# Patient Record
Sex: Female | Born: 1975 | Race: White | Hispanic: No | Marital: Single | Smoking: Former smoker
Health system: Southern US, Community
[De-identification: ages and names within clinical notes are randomized; demographics above are authoritative.]

## PROBLEM LIST (undated history)

## (undated) DIAGNOSIS — R51 Headache: Secondary | ICD-10-CM

## (undated) HISTORY — PX: PILONIDAL CYST EXCISION: SHX744

---

## 2008-11-16 ENCOUNTER — Emergency Department (HOSPITAL_COMMUNITY): Admission: EM | Admit: 2008-11-16 | Discharge: 2008-11-16 | Payer: Self-pay | Admitting: Emergency Medicine

## 2009-07-11 ENCOUNTER — Emergency Department (HOSPITAL_COMMUNITY): Admission: EM | Admit: 2009-07-11 | Discharge: 2009-07-11 | Payer: Self-pay | Admitting: Emergency Medicine

## 2010-10-22 LAB — URINALYSIS, ROUTINE W REFLEX MICROSCOPIC
Protein, ur: NEGATIVE mg/dL
Urobilinogen, UA: 0.2 mg/dL (ref 0.0–1.0)

## 2010-10-22 LAB — POCT PREGNANCY, URINE

## 2011-08-12 ENCOUNTER — Other Ambulatory Visit: Payer: Self-pay | Admitting: Occupational Medicine

## 2011-08-12 ENCOUNTER — Ambulatory Visit: Payer: Self-pay

## 2011-08-12 DIAGNOSIS — R52 Pain, unspecified: Secondary | ICD-10-CM

## 2011-09-10 ENCOUNTER — Ambulatory Visit (INDEPENDENT_AMBULATORY_CARE_PROVIDER_SITE_OTHER): Payer: Worker's Compensation | Admitting: Family Medicine

## 2011-09-10 VITALS — BP 100/60 | Ht 67.0 in | Wt 165.0 lb

## 2011-09-10 DIAGNOSIS — M65849 Other synovitis and tenosynovitis, unspecified hand: Secondary | ICD-10-CM

## 2011-09-10 DIAGNOSIS — S63599A Other specified sprain of unspecified wrist, initial encounter: Secondary | ICD-10-CM

## 2011-09-10 DIAGNOSIS — M659 Synovitis and tenosynovitis, unspecified: Secondary | ICD-10-CM

## 2011-09-10 MED ORDER — KETOPROFEN POWD: Status: DC

## 2011-09-10 NOTE — Progress Notes (Signed)
[facility]:  Patient Name: Emily Case Date of Birth: Aug 22, 1975 Medical Record Number: [id number] Gender: female Date of Encounter: 09/10/2011  Dear Dr. Theresia Lo:  Thank you for having me see Aniceto Boss in consultation today at the [facility] for her problem with her left wrist.  As you may recall, she is a 36 y.o. year old female with a history of a left wrist injury occurring on 08/10/2011.  She reports that she was lifting a 55 pound bag, and then the bag slipped and moved some and then her wrist was forcefully supinated. She was immobilized for approximately a week in a cockup wrist splint, and then she has resumed full duty beginning on 09/03/2011. She did have some pain with motion, particularly the with supination, and she is having pain in that distal dorsum of her forearm.  At this point, she denies any numbness or tingling.  Past medical history: Denies anything significant Past surgical history: Negative Family history: Noncontributory Social history: The patient works at [employer]. She has children.   Review of Systems:  GEN: No fevers, chills. Nontoxic. Primarily MSK c/o today. MSK: Detailed in the HPI GI: tolerating PO intake without difficulty Neuro: No numbness, parasthesias, or tingling associated. Otherwise the pertinent positives of the ROS are noted above.    Physical Examination: Filed Vitals:   09/10/11 1406  BP: 100/60  Height: 5\' 7"  (1.702 m)  Weight: 165 lb (74.844 kg)      GEN: WDWN, NAD, Non-toxic, Alert & Oriented x 3 HEENT: Atraumatic, Normocephalic.  Ears and Nose: No external deformity. EXTR: No clubbing/cyanosis/edema NEURO: Normal gait.  PSYCH: Normally interactive. Conversant. Not depressed or anxious appearing.  Calm demeanor.   Hand: LEFT eymosis or edema: neg ROM wrist/hand/digits/elbow: full  Carpals, MCP's, digits: NT Distal Ulna and Radius: Tender, most notably with squeeze just  proximal to the DRUJ. TFCC is tender to direct palpation Mild pain with radial deviation Ecchymosis or edema: neg Cysts/nodules: neg Finkelstein's test: neg Snuffbox tenderness: neg Scaphoid tubercle: NT Hook of Hamate: NT Resisted supination: NT Full composite fist Grip, all digits: 5/5 str Flexion, ext at DIP, PIP, MCP, wrist all 5/5 Axial load test: neg Phalen's: neg Tinel's: neg Atrophy: neg  Hand sensation: intact  Diagnostic Ultrasound Evalution: [employer] E, MSK ultrasound, MSK probe Anatomy scanned: Left wrist Indication: Pain Findings: Increased fluid in the fourth dorsal compartment. No significant tearing visualized at the distal radial ulnar joint. TFCC appears intact without any obvious tears on ultrasound. Fifth compartment, tended to move easily without significant additional fluid. Third compartment, tendons quite easily without any additional fluid.   Assessment and Plan:  Impression: DRUJ sprain and fourth dorsal compartment tenosynovitis   Recommendations: I given her a wrist loop compression wrap to help stabilize the DRUJ. She is that he uses at work over the next few weeks. In about 2 weeks, she is going to start doing some wrist rehabilitation that we reviewed. Also gave her a prescription for ketoprofen 20% gel. Apply 3-4 times daily over the next 2-3 weeks.  I cannot fully exclude a TFCC lesion, and she certainly does have tenderness there clinically, but no tears visualized on ultrasound. Ultimately if she continues to do poorly, an MR arthrogram of the wrist would be appropriate to evaluate this anatomy.   We will see the patient back in 1 month.  Thank you for having [place] see Aniceto Boss in consultation.  Feel free to contact me  with any questions.  Chelsie Burel T. Clarrisa Kaylor, MD, CAQ Sports Medicine [facility] [address] [place], [place] [address] Phone: [phone] Fax: [phone]

## 2013-05-13 ENCOUNTER — Other Ambulatory Visit (HOSPITAL_COMMUNITY): Payer: Self-pay | Admitting: Obstetrics and Gynecology

## 2013-05-13 DIAGNOSIS — Z3682 Encounter for antenatal screening for nuchal translucency: Secondary | ICD-10-CM

## 2013-05-14 ENCOUNTER — Ambulatory Visit (HOSPITAL_COMMUNITY)
Admission: RE | Admit: 2013-05-14 | Discharge: 2013-05-14 | Disposition: A | Discharge status: Home / Self Care | Payer: Medicaid Other | Source: Ambulatory Visit | Attending: Obstetrics and Gynecology | Admitting: Obstetrics and Gynecology

## 2013-05-14 ENCOUNTER — Encounter (HOSPITAL_COMMUNITY): Payer: Self-pay

## 2013-05-14 ENCOUNTER — Other Ambulatory Visit: Payer: Self-pay

## 2013-05-14 ENCOUNTER — Ambulatory Visit (HOSPITAL_COMMUNITY): Payer: Worker's Compensation

## 2013-05-14 ENCOUNTER — Telehealth (HOSPITAL_COMMUNITY): Payer: Self-pay | Admitting: *Deleted

## 2013-05-14 DIAGNOSIS — Z3682 Encounter for antenatal screening for nuchal translucency: Secondary | ICD-10-CM

## 2013-05-14 DIAGNOSIS — Z3689 Encounter for other specified antenatal screening: Secondary | ICD-10-CM | POA: Insufficient documentation

## 2013-05-14 DIAGNOSIS — O09529 Supervision of elderly multigravida, unspecified trimester: Secondary | ICD-10-CM | POA: Insufficient documentation

## 2013-05-14 DIAGNOSIS — O351XX0 Maternal care for (suspected) chromosomal abnormality in fetus, not applicable or unspecified: Secondary | ICD-10-CM | POA: Insufficient documentation

## 2013-05-14 DIAGNOSIS — O3510X0 Maternal care for (suspected) chromosomal abnormality in fetus, unspecified, not applicable or unspecified: Secondary | ICD-10-CM | POA: Insufficient documentation

## 2013-05-17 NOTE — Progress Notes (Signed)
Genetic Counseling  High-Risk Gestation Note  Appointment Date:  05/14/2013 Referred By: Bing Plume, MD Date of Birth:  [birth year]   Pregnancy History: [id number] Estimated Date of Delivery: 11/14/13 Estimated Gestational Age: [redacted]w[redacted]d Attending: Particia Nearing, MD   Ms. Tonianne Fine was seen for genetic counseling because of a maternal age of 37 y.o..     She was counseled regarding maternal age and the association with risk for chromosome conditions due to nondisjunction with aging of the ova.   We reviewed chromosomes, nondisjunction, and the associated 1 in 114 risk for fetal aneuploidy related to a maternal age of 37 y.o. at [redacted]w[redacted]d gestation.  She was counseled that the risk for aneuploidy decreases as gestational age increases, accounting for those pregnancies which spontaneously abort.  We specifically discussed Down syndrome (trisomy 27), trisomies 15 and 35, and sex chromosome aneuploidies (47,XXX and 47,XXY) including the common features and prognoses of each.   We reviewed available screening options including First Screen, Quad screen, noninvasive prenatal screening (NIPS)/cell free fetal DNA (cffDNA) testing, and detailed ultrasound.  She was counseled that screening tests are used to modify a patient's a priori risk for aneuploidy, typically based on age. This estimate provides a pregnancy specific risk assessment. We reviewed the benefits and limitations of each option. Specifically, we discussed the conditions for which each test screens, the detection rates, and false positive rates of each. She was also counseled regarding diagnostic testing via amniocentesis. We reviewed the approximate 1 in 300-500 risk for complications for amniocentesis, including spontaneous pregnancy loss. After consideration of all the options, she elected to proceed with NIPS (Panorama).  Those results will be available in 8-10 days.  Ms. Jann stated that she is not interested in invasive testing  (amniocentesis) in the pregnancy.   She also expressed interest in pursuing a nuchal translucency ultrasound, which was performed today.  The report will be documented separately.  Detailed ultrasound is also available to the patient at ~18+ weeks gestation.  No follow-up appointments were made at this time. She understands that screening tests cannot rule out all birth defects or genetic syndromes. The patient was advised of this limitation and states she still does not want additional testing at this time.   Detailed family histories were not obtained due to the time constraints of today's appointment. However, the patient reported that the father of the pregnancy has a half-sister with autism. An underlying etiology was not known for this individual. We discussed that autism is part of the spectrum of conditions referred to as Autistic spectrum disorders (ASD). We discussed that ASDs are among the most common neurodevelopmental disorders, with approximately 1 in 88 children meeting criteria for ASD. Approximately 80% of individuals diagnosed are female. There is strong evidence that genetic factors play a critical role in development of ASD. There have been recent advances in identifying specific genetic causes of ASD, however, there are still many individuals for whom the etiology of the ASD is not known. Once a family has a child with a diagnosis of ASD, there is a 13.5% chance to have another child with ASD. If the pregnancy is female the chance is approximately 9%, and approximately 26% if the pregnancy is female. We discussed that limited information is available regarding recurrence risk estimates for extended relatives, such as a third degree relative, in the absence of an identified etiology. They understand that at this time there is not genetic testing available for ASD for most families. The patient reported that  the family histories were otherwise unremarkable for birth defects or intellectual  disability.   I counseled Ms. Aniceto Boss regarding the above risks and available options.  The approximate face-to-face time with the genetic counselor was 16 minutes.  Quinn Plowman, MS,  Certified Genetic Counselor 05/17/2013

## 2013-05-18 LAB — OB RESULTS CONSOLE ANTIBODY SCREEN: ANTIBODY SCREEN: NEGATIVE

## 2013-05-18 LAB — OB RESULTS CONSOLE HEPATITIS B SURFACE ANTIGEN: Hepatitis B Surface Ag: NEGATIVE

## 2013-05-18 LAB — OB RESULTS CONSOLE HIV ANTIBODY (ROUTINE TESTING): HIV: NONREACTIVE

## 2013-05-18 LAB — OB RESULTS CONSOLE GC/CHLAMYDIA
CHLAMYDIA, DNA PROBE: NEGATIVE
GC PROBE AMP, GENITAL: NEGATIVE

## 2013-05-18 LAB — OB RESULTS CONSOLE RUBELLA ANTIBODY, IGM: RUBELLA: IMMUNE

## 2013-05-18 LAB — OB RESULTS CONSOLE ABO/RH: RH Type: NEGATIVE

## 2013-05-18 LAB — OB RESULTS CONSOLE RPR: RPR: NONREACTIVE

## 2013-05-27 ENCOUNTER — Telehealth (HOSPITAL_COMMUNITY): Payer: Self-pay | Admitting: MS"

## 2013-05-27 NOTE — Telephone Encounter (Signed)
Called Tiamarie Furnari to discuss her cell free fetal DNA test results.  Mrs. Aniceto Boss had Panorama testing through [facility].  Testing was offered because of maternal age.   The patient was identified by name and DOB.  We reviewed that these are within normal limits, showing a less than 1 in 10,000 risk for trisomies 21, 18 and 13, and monosomy X (Turner syndrome).  In addition, the risk for triploidy/vanishing twin and sex chromosome trisomies (47,XXX and 47,XXY) was also low risk.  We reviewed that this testing identifies > 99% of pregnancies with trisomy 57, trisomy 63, trisomy 60, sex chromosome trisomies (47,XXX and 47,XXY), and triploidy.  The detection rate for monosomy X is ~92%.  The false positive rate is <0.1% for all conditions. Testing was also consistent with female gender, which was disclosed to the patient, per her request. She understands that this testing does not identify all genetic conditions.  All questions were answered to her satisfaction, she was encouraged to call with additional questions or concerns.  Quinn Plowman, MS Certified Genetic Counselor 05/27/2013 8:54 AM

## 2013-10-12 LAB — OB RESULTS CONSOLE GBS: STREP GROUP B AG: NEGATIVE

## 2013-11-10 ENCOUNTER — Encounter (HOSPITAL_COMMUNITY)
Admission: AD | Disposition: A | Discharge status: Home / Self Care | Payer: Self-pay | Source: Ambulatory Visit | Attending: Obstetrics and Gynecology

## 2013-11-10 ENCOUNTER — Other Ambulatory Visit: Payer: Self-pay | Admitting: Obstetrics and Gynecology

## 2013-11-10 ENCOUNTER — Inpatient Hospital Stay (HOSPITAL_COMMUNITY): Payer: Medicaid Other | Admitting: Anesthesiology

## 2013-11-10 ENCOUNTER — Encounter (HOSPITAL_COMMUNITY): Payer: Self-pay | Admitting: *Deleted

## 2013-11-10 ENCOUNTER — Encounter (HOSPITAL_COMMUNITY): Payer: Medicaid Other | Admitting: Anesthesiology

## 2013-11-10 ENCOUNTER — Inpatient Hospital Stay (HOSPITAL_COMMUNITY)
Admission: AD | Admit: 2013-11-10 | Discharge: 2013-11-13 | DRG: 766 | Disposition: A | Discharge status: Home / Self Care | Payer: Medicaid Other | Source: Ambulatory Visit | Attending: Obstetrics and Gynecology | Admitting: Obstetrics and Gynecology

## 2013-11-10 DIAGNOSIS — D649 Anemia, unspecified: Secondary | ICD-10-CM | POA: Diagnosis not present

## 2013-11-10 DIAGNOSIS — Z302 Encounter for sterilization: Secondary | ICD-10-CM

## 2013-11-10 DIAGNOSIS — O9903 Anemia complicating the puerperium: Secondary | ICD-10-CM | POA: Diagnosis not present

## 2013-11-10 DIAGNOSIS — Z98891 History of uterine scar from previous surgery: Secondary | ICD-10-CM

## 2013-11-10 DIAGNOSIS — Z3490 Encounter for supervision of normal pregnancy, unspecified, unspecified trimester: Secondary | ICD-10-CM

## 2013-11-10 HISTORY — DX: Headache: R51

## 2013-11-10 HISTORY — PX: TUBAL LIGATION: SHX77

## 2013-11-10 LAB — CBC
HEMATOCRIT: 31.8 % — AB (ref 36.0–46.0)
Hemoglobin: 9.6 g/dL — ABNORMAL LOW (ref 12.0–15.0)
MCH: 24.1 pg — ABNORMAL LOW (ref 26.0–34.0)
MCHC: 30.2 g/dL (ref 30.0–36.0)
MCV: 79.9 fL (ref 78.0–100.0)
Platelets: 278 10*3/uL (ref 150–400)
RBC: 3.98 MIL/uL (ref 3.87–5.11)
RDW: 15.4 % (ref 11.5–15.5)
WBC: 12 10*3/uL — ABNORMAL HIGH (ref 4.0–10.5)

## 2013-11-10 LAB — COMPREHENSIVE METABOLIC PANEL
ALT: 16 U/L (ref 0–35)
AST: 18 U/L (ref 0–37)
Albumin: 2.6 g/dL — ABNORMAL LOW (ref 3.5–5.2)
Alkaline Phosphatase: 124 U/L — ABNORMAL HIGH (ref 39–117)
BUN: 13 mg/dL (ref 6–23)
CALCIUM: 8.9 mg/dL (ref 8.4–10.5)
CO2: 24 mEq/L (ref 19–32)
Chloride: 101 mEq/L (ref 96–112)
Creatinine, Ser: 0.61 mg/dL (ref 0.50–1.10)
GFR calc non Af Amer: >90 mL/min (ref >90)
GLUCOSE: 101 mg/dL — AB (ref 70–99)
Potassium: 3.8 mEq/L (ref 3.7–5.3)
Sodium: 138 mEq/L (ref 137–147)
TOTAL PROTEIN: 6.3 g/dL (ref 6.0–8.3)
Total Bilirubin: 0.4 mg/dL (ref 0.3–1.2)

## 2013-11-10 LAB — RPR

## 2013-11-10 SURGERY — Surgical Case
Anesthesia: General | Site: Abdomen

## 2013-11-10 MED ORDER — MIDAZOLAM HCL 2 MG/2ML IJ SOLN
INTRAMUSCULAR | Status: AC
  Filled 2013-11-10: qty 2

## 2013-11-10 MED ORDER — IBUPROFEN 600 MG PO TABS
600.0000 mg | ORAL_TABLET | Freq: Four times a day (QID) | ORAL | Status: DC
  Administered 2013-11-10 – 2013-11-13 (×11): 600 mg via ORAL
  Filled 2013-11-10 (×11): qty 1

## 2013-11-10 MED ORDER — FENTANYL CITRATE 0.05 MG/ML IJ SOLN
INTRAMUSCULAR | Status: AC
  Filled 2013-11-10: qty 2

## 2013-11-10 MED ORDER — SIMETHICONE 80 MG PO CHEW
80.0000 mg | CHEWABLE_TABLET | ORAL | Status: DC
  Administered 2013-11-11 – 2013-11-12 (×3): 80 mg via ORAL
  Filled 2013-11-10 (×3): qty 1

## 2013-11-10 MED ORDER — SIMETHICONE 80 MG PO CHEW
80.0000 mg | CHEWABLE_TABLET | Freq: Three times a day (TID) | ORAL | Status: DC
  Administered 2013-11-11 – 2013-11-12 (×6): 80 mg via ORAL
  Filled 2013-11-10 (×7): qty 1

## 2013-11-10 MED ORDER — PROMETHAZINE HCL 25 MG/ML IJ SOLN: 6.2500 mg | INTRAMUSCULAR | Status: DC | PRN

## 2013-11-10 MED ORDER — SENNOSIDES-DOCUSATE SODIUM 8.6-50 MG PO TABS
2.0000 | ORAL_TABLET | ORAL | Status: DC
  Administered 2013-11-11 – 2013-11-12 (×2): 2 via ORAL
  Filled 2013-11-10 (×2): qty 2

## 2013-11-10 MED ORDER — ACETAMINOPHEN 160 MG/5ML PO SOLN
1000.0000 mg | Freq: Four times a day (QID) | ORAL | Status: DC | PRN
  Administered 2013-11-10: 1000 mg via ORAL

## 2013-11-10 MED ORDER — SIMETHICONE 80 MG PO CHEW: 80.0000 mg | CHEWABLE_TABLET | ORAL | Status: DC | PRN

## 2013-11-10 MED ORDER — SUCCINYLCHOLINE CHLORIDE 20 MG/ML IJ SOLN
INTRAMUSCULAR | Status: DC | PRN
  Administered 2013-11-10: 140 mg via INTRAVENOUS

## 2013-11-10 MED ORDER — CEFAZOLIN SODIUM-DEXTROSE 2-3 GM-% IV SOLR
INTRAVENOUS | Status: AC
  Filled 2013-11-10: qty 50

## 2013-11-10 MED ORDER — LIDOCAINE HCL (CARDIAC) 20 MG/ML IV SOLN
INTRAVENOUS | Status: AC
  Filled 2013-11-10: qty 5

## 2013-11-10 MED ORDER — HYDROMORPHONE HCL PF 1 MG/ML IJ SOLN
INTRAMUSCULAR | Status: AC
  Filled 2013-11-10: qty 1

## 2013-11-10 MED ORDER — HYDROMORPHONE HCL PF 1 MG/ML IJ SOLN
INTRAMUSCULAR | Status: AC
  Administered 2013-11-10: 0.5 mg via INTRAVENOUS
  Filled 2013-11-10: qty 1

## 2013-11-10 MED ORDER — OXYTOCIN 40 UNITS IN LACTATED RINGERS INFUSION - SIMPLE MED
62.5000 mL/h | INTRAVENOUS | Status: DC
Start: 2013-11-10 — End: 2013-11-10

## 2013-11-10 MED ORDER — PROPOFOL 10 MG/ML IV EMUL
INTRAVENOUS | Status: AC
  Filled 2013-11-10: qty 20

## 2013-11-10 MED ORDER — FENTANYL CITRATE 0.05 MG/ML IJ SOLN
INTRAMUSCULAR | Status: DC | PRN
  Administered 2013-11-10: 100 ug via INTRAVENOUS
  Administered 2013-11-10: 150 ug via INTRAVENOUS
  Administered 2013-11-10 (×2): 100 ug via INTRAVENOUS

## 2013-11-10 MED ORDER — LIDOCAINE HCL (PF) 1 % IJ SOLN: 30.0000 mL | INTRAMUSCULAR | Status: DC | PRN

## 2013-11-10 MED ORDER — CEFAZOLIN SODIUM-DEXTROSE 2-3 GM-% IV SOLR
INTRAVENOUS | Status: DC | PRN
  Administered 2013-11-10: 2 g via INTRAVENOUS

## 2013-11-10 MED ORDER — OXYTOCIN 10 UNIT/ML IJ SOLN
INTRAMUSCULAR | Status: AC
  Filled 2013-11-10: qty 4

## 2013-11-10 MED ORDER — LANOLIN HYDROUS EX OINT: 1.0000 "application " | TOPICAL_OINTMENT | CUTANEOUS | Status: DC | PRN

## 2013-11-10 MED ORDER — LACTATED RINGERS IV SOLN
INTRAVENOUS | Status: DC
  Administered 2013-11-10 – 2013-11-11 (×2): via INTRAVENOUS

## 2013-11-10 MED ORDER — ONDANSETRON HCL 4 MG PO TABS: 4.0000 mg | ORAL_TABLET | ORAL | Status: DC | PRN

## 2013-11-10 MED ORDER — MENTHOL 3 MG MT LOZG
1.0000 | LOZENGE | OROMUCOSAL | Status: DC | PRN
  Administered 2013-11-11: 3 mg via ORAL
  Filled 2013-11-10: qty 9

## 2013-11-10 MED ORDER — IBUPROFEN 600 MG PO TABS: 600.0000 mg | ORAL_TABLET | Freq: Four times a day (QID) | ORAL | Status: DC | PRN

## 2013-11-10 MED ORDER — ONDANSETRON HCL 4 MG/2ML IJ SOLN
INTRAMUSCULAR | Status: AC
  Filled 2013-11-10: qty 2

## 2013-11-10 MED ORDER — LACTATED RINGERS IV SOLN
INTRAVENOUS | Status: DC
  Administered 2013-11-10 (×2): via INTRAVENOUS

## 2013-11-10 MED ORDER — PHENYLEPHRINE 8 MG IN D5W 100 ML (0.08MG/ML) PREMIX OPTIME
INJECTION | INTRAVENOUS | Status: DC | PRN
  Administered 2013-11-10: 60 ug/min via INTRAVENOUS

## 2013-11-10 MED ORDER — MIDAZOLAM HCL 2 MG/2ML IJ SOLN: 0.5000 mg | Freq: Once | INTRAMUSCULAR | Status: DC | PRN

## 2013-11-10 MED ORDER — FENTANYL CITRATE 0.05 MG/ML IJ SOLN
INTRAMUSCULAR | Status: AC
  Filled 2013-11-10: qty 5

## 2013-11-10 MED ORDER — KETOROLAC TROMETHAMINE 30 MG/ML IJ SOLN
15.0000 mg | Freq: Once | INTRAMUSCULAR | Status: AC | PRN
  Administered 2013-11-10: 30 mg via INTRAVENOUS

## 2013-11-10 MED ORDER — SUCCINYLCHOLINE CHLORIDE 20 MG/ML IJ SOLN
INTRAMUSCULAR | Status: AC
  Filled 2013-11-10: qty 10

## 2013-11-10 MED ORDER — WITCH HAZEL-GLYCERIN EX PADS: 1.0000 "application " | MEDICATED_PAD | CUTANEOUS | Status: DC | PRN

## 2013-11-10 MED ORDER — CEFAZOLIN SODIUM-DEXTROSE 2-3 GM-% IV SOLR
2.0000 g | Freq: Once | INTRAVENOUS | Status: DC
  Filled 2013-11-10: qty 50

## 2013-11-10 MED ORDER — MIDAZOLAM HCL 2 MG/2ML IJ SOLN
INTRAMUSCULAR | Status: DC | PRN
  Administered 2013-11-10: 2 mg via INTRAVENOUS

## 2013-11-10 MED ORDER — DIBUCAINE 1 % RE OINT: 1.0000 "application " | TOPICAL_OINTMENT | RECTAL | Status: DC | PRN

## 2013-11-10 MED ORDER — HYDROMORPHONE HCL PF 1 MG/ML IJ SOLN
0.2500 mg | INTRAMUSCULAR | Status: DC | PRN
  Administered 2013-11-10 (×4): 0.5 mg via INTRAVENOUS

## 2013-11-10 MED ORDER — PHENYLEPHRINE HCL 10 MG/ML IJ SOLN
INTRAMUSCULAR | Status: DC | PRN
  Administered 2013-11-10 (×2): 80 ug via INTRAVENOUS
  Administered 2013-11-10: 40 ug via INTRAVENOUS

## 2013-11-10 MED ORDER — LACTATED RINGERS IV SOLN
INTRAVENOUS | Status: DC | PRN
  Administered 2013-11-10: 13:00:00 via INTRAVENOUS

## 2013-11-10 MED ORDER — OXYTOCIN 10 UNIT/ML IJ SOLN
40.0000 [IU] | INTRAMUSCULAR | Status: DC | PRN
  Administered 2013-11-10: 40 [IU] via INTRAVENOUS

## 2013-11-10 MED ORDER — DIPHENHYDRAMINE HCL 25 MG PO CAPS: 25.0000 mg | ORAL_CAPSULE | Freq: Four times a day (QID) | ORAL | Status: DC | PRN

## 2013-11-10 MED ORDER — HYDROMORPHONE HCL PF 1 MG/ML IJ SOLN
INTRAMUSCULAR | Status: DC | PRN
  Administered 2013-11-10 (×2): 1 mg via INTRAVENOUS

## 2013-11-10 MED ORDER — OXYCODONE-ACETAMINOPHEN 5-325 MG PO TABS: 1.0000 | ORAL_TABLET | ORAL | Status: DC | PRN

## 2013-11-10 MED ORDER — KETOROLAC TROMETHAMINE 30 MG/ML IJ SOLN
INTRAMUSCULAR | Status: AC
  Administered 2013-11-10: 30 mg via INTRAVENOUS
  Filled 2013-11-10: qty 1

## 2013-11-10 MED ORDER — ONDANSETRON HCL 4 MG/2ML IJ SOLN
INTRAMUSCULAR | Status: DC | PRN
  Administered 2013-11-10: 4 mg via INTRAVENOUS

## 2013-11-10 MED ORDER — LACTATED RINGERS IV SOLN: 500.0000 mL | INTRAVENOUS | Status: DC | PRN

## 2013-11-10 MED ORDER — OXYTOCIN 40 UNITS IN LACTATED RINGERS INFUSION - SIMPLE MED
1.0000 m[IU]/min | INTRAVENOUS | Status: DC
  Administered 2013-11-10: 1 m[IU]/min via INTRAVENOUS
  Filled 2013-11-10: qty 1000

## 2013-11-10 MED ORDER — CEFAZOLIN SODIUM-DEXTROSE 2-3 GM-% IV SOLR
2.0000 g | Freq: Three times a day (TID) | INTRAVENOUS | Status: DC
  Administered 2013-11-10 – 2013-11-11 (×2): 2 g via INTRAVENOUS
  Filled 2013-11-10 (×3): qty 50

## 2013-11-10 MED ORDER — MEASLES, MUMPS & RUBELLA VAC ~~LOC~~ INJ
0.5000 mL | INJECTION | Freq: Once | SUBCUTANEOUS | Status: DC
  Filled 2013-11-10: qty 0.5

## 2013-11-10 MED ORDER — OXYCODONE-ACETAMINOPHEN 5-325 MG PO TABS
1.0000 | ORAL_TABLET | ORAL | Status: DC | PRN
  Administered 2013-11-10 – 2013-11-13 (×9): 2 via ORAL
  Filled 2013-11-10 (×2): qty 2
  Filled 2013-11-10: qty 1
  Filled 2013-11-10 (×5): qty 2
  Filled 2013-11-10: qty 1
  Filled 2013-11-10: qty 2

## 2013-11-10 MED ORDER — PROPOFOL 10 MG/ML IV BOLUS
INTRAVENOUS | Status: DC | PRN
  Administered 2013-11-10: 200 mg via INTRAVENOUS

## 2013-11-10 MED ORDER — OXYTOCIN 40 UNITS IN LACTATED RINGERS INFUSION - SIMPLE MED
62.5000 mL/h | INTRAVENOUS | Status: AC
  Administered 2013-11-10: 62.5 mL/h via INTRAVENOUS
  Filled 2013-11-10: qty 1000

## 2013-11-10 MED ORDER — OXYTOCIN BOLUS FROM INFUSION: 500.0000 mL | INTRAVENOUS | Status: DC

## 2013-11-10 MED ORDER — ZOLPIDEM TARTRATE 5 MG PO TABS: 5.0000 mg | ORAL_TABLET | Freq: Every evening | ORAL | Status: DC | PRN

## 2013-11-10 MED ORDER — MEPERIDINE HCL 25 MG/ML IJ SOLN: 6.2500 mg | INTRAMUSCULAR | Status: DC | PRN

## 2013-11-10 MED ORDER — ONDANSETRON HCL 4 MG/2ML IJ SOLN: 4.0000 mg | INTRAMUSCULAR | Status: DC | PRN

## 2013-11-10 MED ORDER — CITRIC ACID-SODIUM CITRATE 334-500 MG/5ML PO SOLN
ORAL | Status: AC
  Administered 2013-11-10: 13:00:00
  Filled 2013-11-10: qty 15

## 2013-11-10 MED ORDER — TETANUS-DIPHTH-ACELL PERTUSSIS 5-2.5-18.5 LF-MCG/0.5 IM SUSP: 0.5000 mL | Freq: Once | INTRAMUSCULAR | Status: DC

## 2013-11-10 MED ORDER — LIDOCAINE HCL (CARDIAC) 20 MG/ML IV SOLN
INTRAVENOUS | Status: DC | PRN
  Administered 2013-11-10: 50 mg via INTRAVENOUS

## 2013-11-10 MED ORDER — ACETAMINOPHEN 160 MG/5ML PO SOLN
ORAL | Status: AC
  Administered 2013-11-10: 1000 mg via ORAL
  Filled 2013-11-10: qty 40.6

## 2013-11-10 SURGICAL SUPPLY — 34 items
CLAMP CORD UMBIL (MISCELLANEOUS) ×4 IMPLANT
CLOTH BEACON ORANGE TIMEOUT ST (SAFETY) ×4 IMPLANT
CONTAINER PREFILL 10% NBF 15ML (MISCELLANEOUS) ×8 IMPLANT
DRAPE LG THREE QUARTER DISP (DRAPES) ×4 IMPLANT
DRSG OPSITE POSTOP 4X10 (GAUZE/BANDAGES/DRESSINGS) ×4 IMPLANT
DRSG VASELINE 3X18 (GAUZE/BANDAGES/DRESSINGS) ×4 IMPLANT
DURAPREP 26ML APPLICATOR (WOUND CARE) ×4 IMPLANT
ELECT REM PT RETURN 9FT ADLT (ELECTROSURGICAL) ×4
ELECTRODE REM PT RTRN 9FT ADLT (ELECTROSURGICAL) ×2 IMPLANT
GLOVE BIO SURGEON STRL SZ7.5 (GLOVE) ×4 IMPLANT
GLOVE BIOGEL PI IND STRL 7.0 (GLOVE) ×12 IMPLANT
GLOVE BIOGEL PI INDICATOR 7.0 (GLOVE) ×12
GLOVE SURG SS PI 7.0 STRL IVOR (GLOVE) ×20 IMPLANT
GOWN STRL REUS W/TWL LRG LVL3 (GOWN DISPOSABLE) ×16 IMPLANT
KIT ABG SYR 3ML LUER SLIP (SYRINGE) ×4 IMPLANT
NEEDLE HYPO 25X5/8 SAFETYGLIDE (NEEDLE) ×4 IMPLANT
NS IRRIG 1000ML POUR BTL (IV SOLUTION) ×4 IMPLANT
PACK C SECTION WH (CUSTOM PROCEDURE TRAY) ×4 IMPLANT
PAD OB MATERNITY 4.3X12.25 (PERSONAL CARE ITEMS) ×4 IMPLANT
SPONGE LAP 18X18 X RAY DECT (DISPOSABLE) ×16 IMPLANT
STAPLER VISISTAT 35W (STAPLE) ×4 IMPLANT
SUT PLAIN 0 NONE (SUTURE) ×4 IMPLANT
SUT VIC AB 0 CT1 36 (SUTURE) ×32 IMPLANT
SUT VIC AB 2-0 SH 27 (SUTURE) ×6
SUT VIC AB 2-0 SH 27XBRD (SUTURE) ×6 IMPLANT
SUT VIC AB 3-0 CTX 36 (SUTURE) ×4 IMPLANT
SUT VIC AB 3-0 SH 27 (SUTURE) ×2
SUT VIC AB 3-0 SH 27X BRD (SUTURE) ×2 IMPLANT
SUT VIC AB 4-0 KS 27 (SUTURE) ×4 IMPLANT
SUT VICRYL 0 TIES 12 18 (SUTURE) ×4 IMPLANT
TOWEL OR 17X24 6PK STRL BLUE (TOWEL DISPOSABLE) ×4 IMPLANT
TOWEL OR 17X26 10 PK STRL BLUE (TOWEL DISPOSABLE) ×12 IMPLANT
TRAY FOLEY CATH 14FR (SET/KITS/TRAYS/PACK) ×4 IMPLANT
WATER STERILE IRR 1000ML POUR (IV SOLUTION) ×4 IMPLANT

## 2013-11-10 NOTE — Progress Notes (Signed)
Patient ID: Emily BossAngela Charland, female   DOB: 1976-05-11, 38 y.o.   MRN: [id number] Pt is awake and alert. Output is slightly low but pt is only getting 62.5 cc's IV fluid per hour. I will increase that to what is ordered. The abdomen is soft and not tender.

## 2013-11-10 NOTE — Anesthesia Preprocedure Evaluation (Signed)
Anesthesia Evaluation  Patient identified by MRN, date of birth, ID band Patient awake    Reviewed: Allergy & Precautions, H&P , Patient's Chart, lab work & pertinent test results, reviewed documented beta blocker date and time   History of Anesthesia Complications Negative for: history of anesthetic complications  Airway Mallampati: II TM Distance: >3 FB Neck ROM: full    Dental   Pulmonary former smoker,  breath sounds clear to auscultation        Cardiovascular Exercise Tolerance: Good Rhythm:regular Rate:Normal     Neuro/Psych    GI/Hepatic   Endo/Other    Renal/GU      Musculoskeletal   Abdominal   Peds  Hematology   Anesthesia Other Findings   Reproductive/Obstetrics (+) Pregnancy                           Anesthesia Physical Anesthesia Plan  ASA: II and emergent  Anesthesia Plan: General ETT   Post-op Pain Management:    Induction:   Airway Management Planned:   Additional Equipment:   Intra-op Plan:   Post-operative Plan:   Informed Consent: I have reviewed the patients History and Physical, chart, labs and discussed the procedure including the risks, benefits and alternatives for the proposed anesthesia with the patient or authorized representative who has indicated his/her understanding and acceptance.   Dental Advisory Given  Plan Discussed with: CRNA and Surgeon  Anesthesia Plan Comments:         Anesthesia Quick Evaluation

## 2013-11-10 NOTE — Anesthesia Postprocedure Evaluation (Signed)
  Anesthesia Post Note  Patient: Emily Case  Procedure(s) Performed: Procedure(s) (LRB): CESAREAN SECTION (N/A) BILATERAL TUBAL LIGATION (Bilateral)  Anesthesia type: GA  Patient location: PACU  Post pain: Pain level controlled  Post assessment: Post-op Vital signs reviewed  Last Vitals:  Filed Vitals:   11/10/13 1230  BP: 120/86  Pulse: 67  Temp:   Resp:     Post vital signs: Reviewed  Level of consciousness: sedated  Complications: No apparent anesthesia complications

## 2013-11-10 NOTE — Plan of Care (Signed)
At 12:33 Dr Ambrose MantleHenley arom pt with prolapse of cord. MD displaced head off off fetal head until nurse could replace MD and head was continuously displaced until delivery of infant. Pt was moved stat to OR Suite for primary c/s under general anesthesia. Pt and family at bedside aware of emergency situation.

## 2013-11-10 NOTE — Op Note (Signed)
NAMMarland Kitchen:  Emily Case, Emily Case                ACCOUNT NO.:  [id number]  MEDICAL RECORD NO.:  [id number]  LOCATION:  [facility]                          [facility]ACILITY:  [facility]  PHYSICIAN:  Malachi Prohomas F. Ambrose MantleHenley, M.D. DATE OF BIRTH:  1976/01/05  DATE OF PROCEDURE:  11/10/2013 DATE OF DISCHARGE:                              OPERATIVE REPORT   PREOPERATIVE DIAGNOSES:  Intrauterine pregnancy at 39+ weeks, history of placenta previa during pregnancy that resolved by 33 weeks, prolapsed umbilical cord.  POSTOPERATIVE DIAGNOSES:  Intrauterine pregnancy at 39+ weeks, history of placenta previa during pregnancy that resolved by 33 weeks, prolapsed umbilical cord.  OPERATION:  Low-transverse cervical C-section.  OPERATOR:  Malachi Prohomas F. Ambrose MantleHenley, M.D.  ANESTHESIA:  General anesthesia by Dr. Brayton CavesFreeman Jackson.  DESCRIPTION OF PROCEDURE:  I was in the patient's room, examined her cervix, she was 3-4 cm dilated, 50% effaced.  The vertex was -3 station. I attempted an amniotomy but it was unsuccessful.  The patient had a slight dip in the fetal heart rate.  I pushed the uterus to the left, the fetal heart rate recovered.  I then did an amniotomy with the next contraction, clear fluid was obtained, and I felt the umbilical cord prolapsed into the vagina.  I called for stat C-section and the patient was quickly prepared and sent to the operating room.  In the operating room, the nursing staff prepped the abdomen.  Dr. Brayton CavesFreeman Jackson was prepared to intubate the patient.  He asked me to drape the abdomen as a sterile field.  He intubated the patient, gave me clearance to proceed with a C-section.  I made an incision through the lower abdominal wall in a transverse fashion, carried in layers to the fascia.  Separated the subcutaneous tissue from the fascia.  Incised the fascia transversely. Separated it from the rectus muscles superiorly.  Split the muscle in the midline.  Opened the peritoneum vertically.  Used a  bladder retractor, made a short transverse incision into the lower part of the uterus, the superficial layers of the myometrium, went the rest of the way with my finger, enlarged the incision, pull the baby's head through the abdominal incision and delivered the rest of the baby.  The baby cried as soon as I delivered onto the abdominal wall.  The cord was clamped.  The infant was given to the neonatologist, who was in attendance, but the baby was vigorously crying as it was taken to the neonatal team.  There was significant bleeding from the uterine incision.  I tried to identify the uterine incision.  Removed the placenta quickly so that I could exteriorize the uterus.  I saw no extremely severe bleeders, but there was significant bleeding coming from the posterior aspect of the lower uterine segment.  I sutured these bleeders with a 0 Vicryl figure-of-eight sutures and got good hemostasis.  I then closed the uterus with 2 running sutures of 0 Vicryl, locking the first layer, nonlocking on the second layer.  The patient requested, right before she went to sleep, to tie her tubes. There were paratubal and paraovarian adhesions on both sides. Identified the tubes, created a window in the mesosalpinx, placed 2 ties  of 0 plain catgut proximally and distally, and then cut the intervening segment of tube on both sides.  The actual tube on the right side, the Bovie did not seem to function well in going through the mesosalpinx and actually got a bleeder in the mesosalpinx, so therefore I used a Kelly clamp to control the bleeding, and removed the portion of tube in between and sutured the area that I had clamped with a Kelly on the bleeder.  Hemostasis was completely adequate.  I removed all blood from the abdominal cavity.  There was a couple of bleeders on the uterine incision that I sutured with figure-of-eight sutures of 0 Vicryl.  There was no bleeding  at this point.  I closed the  abdominal wall with interrupted sutures of 0 Vicryl, closing the peritoneum and the rectus muscle in 1 layer.  I used 2 running sutures of 0  Vicryl on the fascia, running 3-0 Vicryl on the subcutaneous tissue, and staples on the skin. The patient seemed to tolerate the procedure well.  Blood loss was hard to estimate, the nurse anesthetist estimated 1000 mL.  It may have been more than that.  Sponge and needle counts were correct and she was returned to recovery in satisfactory condition.     Malachi Prohomas F. Ambrose MantleHenley, M.D.     TFH/MEDQ  D:  11/10/2013  T:  11/10/2013  Job:  [id number]

## 2013-11-10 NOTE — Transfer of Care (Signed)
Immediate Anesthesia Transfer of Care Note  Patient: Emily Case  Procedure(s) Performed: Procedure(s): CESAREAN SECTION (N/A) BILATERAL TUBAL LIGATION (Bilateral)  Patient Location: PACU  Anesthesia Type:General  Level of Consciousness: awake, alert  and oriented  Airway & Oxygen Therapy: Patient Spontanous Breathing and Patient connected to nasal cannula oxygen  Post-op Assessment: Report given to PACU RN and Post -op Vital signs reviewed and stable  Post vital signs: Reviewed and stable  Complications: No apparent anesthesia complications

## 2013-11-10 NOTE — Progress Notes (Signed)
Patient ID: [name] Joa, female   DOB: [birth year], 38 y.o.   MRN: [id number] This pt was admitted and begun on pitocin. This note is written well after the event because I needed to go to my office after I did the c section. I came to check on her at 12:30 PM and the nurse had founh her to be 3 cm 50% effaced and the vertex at - 2 station on admission. She was having regular contractiions and thye cervix was 3-4 cm 50 % effaced and thje vertex was at - 3 station. I performed an exam planning to do an amniotomy but was not successful. After the conteraction the FHR slowed to 105-110 and I displaced the uterus to the left with good recovery. With the next contraction amniotomy was successful with clear fluid but the cord prolapsed into the vagina. I called for a stat c section,nurse Lillia MountainWilkins assumed my place holding up the vertex and while I changed clothes the pt was transported to the OR 9 and when I arrived in the OR the pt's abdomen had been prepped with betadine and Dr. Brayton CavesFreeman Jackson was at the head of the bed ready to place her under general anesthesia. He asked me to drape the abdomen and then he induced general anesthesia and intubated her. He stated it was OK for me to proceed with the c section. The Op note has been dictated and after the surgery,there was a debriefing.

## 2013-11-11 ENCOUNTER — Encounter (HOSPITAL_COMMUNITY): Payer: Self-pay | Admitting: Obstetrics and Gynecology

## 2013-11-11 LAB — CBC
HEMATOCRIT: 15.5 % — AB (ref 36.0–46.0)
HEMOGLOBIN: 4.8 g/dL — AB (ref 12.0–15.0)
MCH: 24.2 pg — AB (ref 26.0–34.0)
MCHC: 31 g/dL (ref 30.0–36.0)
MCV: 78.3 fL (ref 78.0–100.0)
PLATELETS: 205 10*3/uL (ref 150–400)
RBC: 1.98 MIL/uL — AB (ref 3.87–5.11)
RDW: 15.5 % (ref 11.5–15.5)
WBC: 15.4 10*3/uL — ABNORMAL HIGH (ref 4.0–10.5)

## 2013-11-11 LAB — PREPARE RBC (CROSSMATCH)

## 2013-11-11 NOTE — Anesthesia Postprocedure Evaluation (Signed)
  Anesthesia Post-op Note  Patient: Emily Case  Procedure(s) Performed: Procedure(s): CESAREAN SECTION (N/A) BILATERAL TUBAL LIGATION (Bilateral)  Patient Location: Mother/Baby  Anesthesia Type:General  Level of Consciousness: awake, alert , oriented and patient cooperative  Airway and Oxygen Therapy: Patient Spontanous Breathing  Post-op Pain: mild  Post-op Assessment: Patient's Cardiovascular Status Stable, Respiratory Function Stable, No signs of Nausea or vomiting and Pain level controlled Blood transfusion pending Hgb 4.8  Post-op Vital Signs: stable  Last Vitals:  Filed Vitals:   11/11/13 0952  BP: 93/59  Pulse: 89  Temp: 36.9 C  Resp: 16    Complications: No apparent anesthesia complications

## 2013-11-11 NOTE — Lactation Note (Signed)
This note was copied from the chart of Emily Aniceto Bossngela Wolken. Lactation Consultation Note Mom states that baby is BF great. Previously BF 2 older children yrs. Ago, but this current baby is BF the best. Denies pain when latching. Has good breast anatomy. No redness noted. States hears swallows. Knowledge of hand expression. Reviewed Baby & Me book's Breastfeeding Basics. Mom encouraged to feed baby w/feeding cuesMom reports + breast changes w/pregnancy. Mom made aware of O/P services, breastfeeding support groups, community resources, and our phone # for post-discharge questions. Mother informed of post-discharge support and given phone number to the lactation department, including services for phone call assistance; out-patient appointments; and breastfeeding support group. List of other breastfeeding resources in the community given in the handout. Encouraged mother to call for problems or concerns related to breastfeeding.Encouraged to call for assistance if needed and to verify proper latch. Patient Name: Emily Case [id number]'XToday's Date: 11/11/2013     Maternal Data    Feeding Feeding Type: Breast Fed Length of feed: 15 min  [facility]                      Lactation Tools Discussed/Used     Consult Status      Emily Case 11/11/2013, 5:10 AM

## 2013-11-11 NOTE — Progress Notes (Signed)
Patient ID: Emily BossAngela Chavers, female   DOB: [birth year], 38 y.o.   MRN: [id number] #1 afebrile BP OK output good HGB 4.8 The abdomen is soft and not especially tender. Advised her of the HGB and need for blood. Risks of transfusion given to pt. She agrees to get 2 units. Audelia Actonheis was discussed at 7 AM but I am late writing the note

## 2013-11-11 NOTE — Addendum Note (Signed)
Addendum created 11/11/13 1029 by Earmon PhoenixValerie P Lamarion Mcevers, CRNA   Modules edited: Notes Section   Notes Section:  File: [id number]; Pend: [id number]

## 2013-11-12 LAB — TYPE AND SCREEN
ABO/RH(D): A NEG
Antibody Screen: POSITIVE
DAT, IGG: NEGATIVE
UNIT DIVISION: 0
Unit division: 0

## 2013-11-12 LAB — CBC
HEMATOCRIT: 20 % — AB (ref 36.0–46.0)
Hemoglobin: 6.4 g/dL — CL (ref 12.0–15.0)
MCH: 25.9 pg — ABNORMAL LOW (ref 26.0–34.0)
MCHC: 32 g/dL (ref 30.0–36.0)
MCV: 81 fL (ref 78.0–100.0)
PLATELETS: 235 10*3/uL (ref 150–400)
RBC: 2.47 MIL/uL — ABNORMAL LOW (ref 3.87–5.11)
RDW: 15.3 % (ref 11.5–15.5)
WBC: 16.8 10*3/uL — ABNORMAL HIGH (ref 4.0–10.5)

## 2013-11-12 MED ORDER — FERROUS SULFATE 325 (65 FE) MG PO TABS
325.0000 mg | ORAL_TABLET | Freq: Three times a day (TID) | ORAL | Status: AC
  Administered 2013-11-12 (×2): 325 mg via ORAL
  Filled 2013-11-12 (×3): qty 1

## 2013-11-12 NOTE — H&P (Signed)
NAMMarland Kitchen:  Carolynne EdouardJONES, Jeralynn                ACCOUNT NO.:  [id number]  MEDICAL RECORD NO.:  [id number]  LOCATION:                                 FACILITY:  PHYSICIAN:  Malachi Prohomas F. Ambrose MantleHenley, M.D. DATE OF BIRTH:  30-May-1976  DATE OF ADMISSION:  11/10/2013 DATE OF DISCHARGE:                             HISTORY & PHYSICAL   PRESENT ILLNESS:  This is a 38 year old white female, para 1-1-0-2, admitted with 39+ weeks gestation for induction of labor.  Her ultrasound on May 14, 2013, viable intrauterine pregnancy, 14 weeks 2 days, [facility].  Blood group and type, A negative.  Negative antibody.  RPR nonreactive.  Hepatitis B surface antigen negative, HIV negative, GC and Chlamydia negative.  Pap test normal.  Genetic screens normal.  Second trimester quad screen normal.  A 1-hour Glucola 139.  A 3-hour GTT 70, 151, 115, and 83.  Repeat HIV and RPR negative.  Group B strep negative.  The patient received RhoGAM on August 20, 2013.  She began her prenatal course at 16 weeks and 4 days.  Panorama test showed low risk of chromosomal abnormalities.  Negative AFP.  An 18-week ultrasound showed a placenta previa.  At 26 weeks her placenta was still low-lying.  She was noted to be anemic.  Asked to take an iron.  At 33 weeks her placenta previa had resolved.  The patient requested tubal ligation, and she has apparently signed papers for that.  PAST MEDICAL HISTORY:  History of anemia, chickenpox, and migraines.  She has never had any surgery.  She did have a colposcopy 1 year ago.  ALLERGIES:  No known drug allergies.  No latex allergy.  She did have vaginal deliveries in 2002 and 2005.  Both birth weights she thought were 8 pounds.  She gives a history of colposcopies for abnormal Paps.  FAMILY HISTORY:  Mother with high blood pressure, heart disease, and diabetes; and males in her family have had colon and prostate cancer.  SOCIAL HISTORY:  The patient was never a smoker.  Does not  drink alcohol.  Says she has had 2-1/2 years of college and works at [employer].  PHYSICAL EXAMINATION:  VITAL SIGNS:  Her last prenatal exam on November 08, 2013, the blood pressure was 120/70, pulse was 80, weight 210 pounds. HEART:  Normal size and sounds.  No murmurs. LUNGS:  Clear to auscultation. ABDOMEN:  Soft.  Fundal height 40 cm.  Cervix 2 cm, 30%, vertex at a -3.  ADMITTING IMPRESSION:  Intrauterine pregnancy at 39 weeks and 3 days. The patient is admitted for induction of labor.     Malachi Prohomas F. Ambrose MantleHenley, M.D.     TFH/MEDQ  D:  11/10/2013  T:  11/10/2013  Job:  [id number]

## 2013-11-12 NOTE — Progress Notes (Signed)
Patient ID: [name] Case, female   DOB: [birth year], 38 y.o.   MRN: [id number] #2 afebrile BP normal ambulating without difficulty. No signs of postural hypotension even when HGB 4+ With 2 units of PRBC';s the HGB is 6.4 so she does not need more blood.She is tolerating a regular diet, passing flatus, voiding well and feels better.

## 2013-11-13 LAB — CBC
HCT: 23.2 % — ABNORMAL LOW (ref 36.0–46.0)
Hemoglobin: 7.4 g/dL — ABNORMAL LOW (ref 12.0–15.0)
MCH: 26.3 pg (ref 26.0–34.0)
MCHC: 31.9 g/dL (ref 30.0–36.0)
MCV: 82.6 fL (ref 78.0–100.0)
Platelets: 275 10*3/uL (ref 150–400)
RBC: 2.81 MIL/uL — ABNORMAL LOW (ref 3.87–5.11)
RDW: 15.9 % — ABNORMAL HIGH (ref 11.5–15.5)
WBC: 15.1 10*3/uL — ABNORMAL HIGH (ref 4.0–10.5)

## 2013-11-13 MED ORDER — OXYCODONE-ACETAMINOPHEN 5-325 MG PO TABS: 1.0000 | ORAL_TABLET | ORAL | Status: DC | PRN

## 2013-11-13 MED ORDER — IBUPROFEN 600 MG PO TABS: 600.0000 mg | ORAL_TABLET | Freq: Four times a day (QID) | ORAL | Status: DC

## 2013-11-13 NOTE — Plan of Care (Signed)
Problem: Discharge Progression Outcomes Goal: Remove staples per MD order Outcome: Not Met (add Reason) Removal in office

## 2013-11-13 NOTE — Discharge Summary (Signed)
Obstetric Discharge Summary Reason for Admission: induction of labor Prenatal Procedures: none Intrapartum Procedures: cesarean: low cervical, transverse, bilateral tubal ligation Postpartum Procedures: transfusion of 2 units of PRBC's Complications-Operative and Postpartum: anemia Hemoglobin  Date Value Ref Range Status  11/13/2013 7.4* 12.0 - 15.0 g/dL Final     HCT  Date Value Ref Range Status  11/13/2013 23.2* 36.0 - 46.0 % Final    Physical Exam:  General: alert and cooperative Lochia: appropriate Uterine Fundus: firm Incision: C/D/I   Discharge Diagnoses: Term Pregnancy-delivered                                         Prolapse of Umbilical cord                                         Primary LTCS                                         Bilateral tubal ligation Discharge Information: Date: 11/13/2013 Activity: pelvic rest Diet: routine Medications: Ibuprofen and Percocet Condition: improved Instructions: refer to practice specific booklet Discharge to: home Follow-up Information   Follow up with Bing PlumeHENLEY,THOMAS F, MD In 4 days. (staple removal and circumcision--call to confirm appointment time)    Specialty:  Obstetrics and Gynecology   Contact information:   [address], [address], [address] [place] [id number] [phone]       Newborn Data: Live born female  Birth Weight: 8 lb 2 oz (3685 g) APGAR: 7, 8  Home with mother. Plans circumcision in the office  Emily Case 11/13/2013, 10:50 AM

## 2013-11-13 NOTE — Lactation Note (Signed)
This note was copied from the chart of Emily Case Binz. Lactation Consultation Note Follow up consult with this mom and baby, now 68 hours post partum, and being discharged to home today. On exam, mom is full, and has transitional milk in an easy stream, with hand expression. I positioned mom for football hold, and showed her how to bring the baby to her, not breast to baby,have baby nipple to nose, and to hold her breast throughout feeding, and  To not make a "breathing hole" for the baby, since all these things is probably what has caused her sore nipples. Breast care reviewed with mom, and mom knows to calll for questions/concerns and o/p consults, prn. Patient Name: Emily Case Przybylski WGNFA'OToday's Date: 11/13/2013 Reason for consult: Follow-up assessment   Maternal Data    Feeding Feeding Type: Breast Fed  LATCH Score/Interventions Latch: Grasps breast easily, tongue down, lips flanged, rhythmical sucking. Intervention(s): Adjust position;Assist with latch  Audible Swallowing: Spontaneous and intermittent  Type of Nipple: Everted at rest and after stimulation  Comfort (Breast/Nipple): Filling, red/small blisters or bruises, mild/mod discomfort  Problem noted: Filling;Mild/Moderate discomfort Interventions  (Cracked/bleeding/bruising/blister): Expressed breast milk to nipple Interventions (Mild/moderate discomfort): Comfort gels  Hold (Positioning): Assistance needed to correctly position infant at breast and maintain latch. Intervention(s): Breastfeeding basics reviewed;Support Pillows;Position options;Skin to skin  LATCH Score: 8  Lactation Tools Discussed/Used     Consult Status Consult Status: Complete Follow-up type: Call as needed    Alfred LevinsChristine Anne Royetta Probus 11/13/2013, 9:07 AM

## 2013-11-13 NOTE — Progress Notes (Signed)
Subjective: Postpartum Day 2 Cesarean Delivery Patient reports tolerating PO and no problems voiding.  Ambulating fine, minimal lochia  Objective: Vital signs in last 24 hours: Temp:  [97.9 F (36.6 C)-98 F (36.7 C)] 97.9 F (36.6 C) (04/25 0525) Pulse Rate:  [66-79] 66 (04/25 0525) Resp:  [18] 18 (04/25 0525) BP: (104-105)/(62-71) 105/71 mmHg (04/25 0525) SpO2:  [100 %] 100 % (04/24 1800)  Physical Exam:  General: alert and cooperative Lochia: appropriate Uterine Fundus: firm Incision: C/D/I    Recent Labs  11/12/13 0530 11/13/13 0621  HGB 6.4* 7.4*  HCT 20.0* 23.2*    Assessment/Plan: Status post Cesarean section. Doing well postoperatively.  Discharge home with standard precautions and return to clinic in 4 days for staple removal.  Oliver PilaKathy W Angellica Maddison 11/13/2013, 10:39 AM

## 2014-05-23 ENCOUNTER — Encounter (HOSPITAL_COMMUNITY): Payer: Self-pay | Admitting: Obstetrics and Gynecology

## 2014-06-27 ENCOUNTER — Encounter (HOSPITAL_COMMUNITY): Payer: Self-pay | Admitting: Obstetrics and Gynecology

## 2014-09-04 ENCOUNTER — Encounter (HOSPITAL_COMMUNITY): Payer: Self-pay | Admitting: Emergency Medicine

## 2014-09-04 ENCOUNTER — Emergency Department (HOSPITAL_COMMUNITY): Payer: PRIVATE HEALTH INSURANCE

## 2014-09-04 ENCOUNTER — Ambulatory Visit (HOSPITAL_COMMUNITY)
Admission: EM | Admit: 2014-09-04 | Discharge: 2014-09-05 | Disposition: A | Discharge status: Home / Self Care | Payer: PRIVATE HEALTH INSURANCE | Attending: Emergency Medicine | Admitting: Emergency Medicine

## 2014-09-04 DIAGNOSIS — Z791 Long term (current) use of non-steroidal anti-inflammatories (NSAID): Secondary | ICD-10-CM | POA: Diagnosis not present

## 2014-09-04 DIAGNOSIS — Y999 Unspecified external cause status: Secondary | ICD-10-CM | POA: Diagnosis not present

## 2014-09-04 DIAGNOSIS — Y939 Activity, unspecified: Secondary | ICD-10-CM | POA: Diagnosis not present

## 2014-09-04 DIAGNOSIS — S83014A Lateral dislocation of right patella, initial encounter: Secondary | ICD-10-CM | POA: Insufficient documentation

## 2014-09-04 DIAGNOSIS — Y929 Unspecified place or not applicable: Secondary | ICD-10-CM | POA: Diagnosis not present

## 2014-09-04 DIAGNOSIS — Z79899 Other long term (current) drug therapy: Secondary | ICD-10-CM | POA: Insufficient documentation

## 2014-09-04 DIAGNOSIS — S83004A Unspecified dislocation of right patella, initial encounter: Secondary | ICD-10-CM

## 2014-09-04 DIAGNOSIS — Z87891 Personal history of nicotine dependence: Secondary | ICD-10-CM | POA: Insufficient documentation

## 2014-09-04 DIAGNOSIS — M25561 Pain in right knee: Secondary | ICD-10-CM | POA: Diagnosis present

## 2014-09-04 DIAGNOSIS — W19XXXA Unspecified fall, initial encounter: Secondary | ICD-10-CM | POA: Insufficient documentation

## 2014-09-04 MED ORDER — HYDROMORPHONE HCL 1 MG/ML IJ SOLN
2.0000 mg | Freq: Once | INTRAMUSCULAR | Status: AC
  Administered 2014-09-04: 2 mg via INTRAVENOUS
  Filled 2014-09-04: qty 2

## 2014-09-04 MED ORDER — MIDAZOLAM HCL 2 MG/2ML IJ SOLN
2.0000 mg | Freq: Once | INTRAMUSCULAR | Status: DC
Start: 2014-09-04 — End: 2014-09-04

## 2014-09-04 MED ORDER — ETOMIDATE 2 MG/ML IV SOLN
14.0000 mg | Freq: Once | INTRAVENOUS | Status: AC
  Administered 2014-09-04: 14 mg via INTRAVENOUS
  Filled 2014-09-04: qty 10

## 2014-09-04 MED ORDER — DIAZEPAM 5 MG/ML IJ SOLN
5.0000 mg | Freq: Once | INTRAMUSCULAR | Status: AC
  Administered 2014-09-04: 5 mg via INTRAVENOUS
  Filled 2014-09-04: qty 2

## 2014-09-04 NOTE — ED Notes (Signed)
[facility] and This nurse at bedside.

## 2014-09-04 NOTE — ED Provider Notes (Signed)
CSN: [id number]     Arrival date & time 09/04/14  2205 History   First MD Initiated Contact with Patient 09/04/14 2213     Chief Complaint  Patient presents with  . Leg Injury  . Fall     (Consider location/radiation/quality/duration/timing/severity/associated sxs/prior Treatment) HPI  Past Medical History  Diagnosis Date  . Preterm labor   . WUJWJXBJ(478.2)    Past Surgical History  Procedure Laterality Date  . Pilonidal cyst excision    . Cesarean section N/A 11/10/2013    Procedure: CESAREAN SECTION;  Surgeon: Bing Plume, MD;  Location: [facility];  Service: Obstetrics;  Laterality: N/A;  . Tubal ligation Bilateral 11/10/2013    Procedure: BILATERAL TUBAL LIGATION;  Surgeon: Bing Plume, MD;  Location: [facility];  Service: Obstetrics;  Laterality: Bilateral;   No family history on file. History  Substance Use Topics  . Smoking status: Former Games developer  . Smokeless tobacco: Former Neurosurgeon    Quit date: 07/22/1998  . Alcohol Use: No   OB History    Gravida Para Term Preterm AB TAB SAB Ectopic Multiple Living   Review of Systems    Allergies  Review of patient's allergies indicates no known allergies.  Home Medications   Prior to Admission medications   Medication Sig Start Date End Date Taking? Authorizing Provider  Multiple Vitamin (MULTIVITAMIN WITH MINERALS) TABS tablet Take 1 tablet by mouth daily.   Yes Historical Provider, MD  ibuprofen (ADVIL,MOTRIN) 600 MG tablet Take 1 tablet (600 mg total) by mouth every 6 (six) hours. Patient not taking: Reported on 09/04/2014 11/13/13   Oliver Pila, MD  oxyCODONE-acetaminophen (PERCOCET/ROXICET) 5-325 MG per tablet Take 1-2 tablets by mouth every 4 (four) hours as needed for severe pain (moderate - severe pain). Patient not taking: Reported on 09/04/2014 11/13/13   Oliver Pila, MD   BP 137/63 mmHg  Pulse 82  Temp(Src) 99 F (37.2 C) (Oral)  Resp 14  Ht  (1.702 m)  Wt 200 lb (90.719  kg)  BMI 31.32 kg/m2  SpO2 98%  Breastfeeding? Yes Physical Exam  ED Course  Procedural sedation Date/Time: 09/04/2014 11:20 PM Performed by: Doug Sou Authorized by: Doug Sou Consent: Verbal consent obtained. Written consent obtained. Risks and benefits: risks, benefits and alternatives were discussed Consent given by: patient Patient understanding: patient states understanding of the procedure being performed Patient consent: the patient's understanding of the procedure matches consent given Procedure consent: procedure consent matches procedure scheduled Relevant documents: relevant documents present and verified Test results: test results available and properly labeled Site marked: the operative site was not marked Imaging studies: imaging studies available Required items: required blood products, implants, devices, and special equipment available Patient identity confirmed: verbally with patient, arm band, provided demographic data and hospital-assigned identification number Time out: Immediately prior to procedure a "time out" was called to verify the correct patient, procedure, equipment, support staff and site/side marked as required. Local anesthesia used: no Patient sedated: yes Sedation type: moderate (conscious) sedation Sedatives: etomidate Sedation start date/time: 09/04/2014 11:27 PM Sedation end date/time: 09/04/2014 11:40 PM   (including critical care time) Labs Review Labs Reviewed - No data to display  Imaging Review Dg Knee Right Port  09/04/2014   CLINICAL DATA:  Status post fall.  Right knee pain and injury.  EXAM: PORTABLE RIGHT KNEE - 1-2 VIEW  COMPARISON:  None.  FINDINGS: The patella  is laterally dislocated. No fracture is identified. Joint spaces are preserved. Calcification projects just superior to the medial epicondyle of the distal femur and may be related to old gastrocnemius trauma or could be a loose body. Small joint effusion is noted.   IMPRESSION: The patella is laterally dislocated.  No fracture is identified.   Electronically Signed   By: Drusilla Kannerhomas  Dalessio M.D.   On: 09/04/2014 22:58     EKG Interpretation None      MDM   Final diagnoses:  None        Doug SouSam Mackenzie Lia, MD 09/04/14 2345

## 2014-09-04 NOTE — ED Notes (Signed)
Patient placed on Cardiac monitor, Ambu bag at bedside, Suction set-up. zoll at bedside. 2L of o2 placed on patient

## 2014-09-04 NOTE — ED Provider Notes (Signed)
Ms.Muthersbaugh attempted closed reduction without success and i subsequently atttempted closed reduction under same sedation period, also without success  Doug SouSam Birda Didonato, MD 09/04/14 2341

## 2014-09-04 NOTE — ED Notes (Signed)
Pt was at home walked into her house and slipped on tile. Pt states once she fell her "leg was twisted.

## 2014-09-04 NOTE — ED Provider Notes (Signed)
CSN: [id number]     Arrival date & time 09/04/14  2205 History   First MD Initiated Contact with Patient 09/04/14 2213     Chief Complaint  Patient presents with  . Leg Injury  . Fall     (Consider location/radiation/quality/duration/timing/severity/associated sxs/prior Treatment) Patient is a 39 y.o. female presenting with fall. The history is provided by the patient and medical records. No language interpreter was used.  Fall Associated symptoms include arthralgias and joint swelling. Pertinent negatives include no chills, fever, nausea, neck pain, numbness or vomiting.     Emily Case is a 39 y.o. female  with a hx of migraine headache presents to the Emergency Department complaining of acute right knee pain after fall onset 30 minutes prior to arrival. Patient reports she walked in to her house with wet boots and slipped on the tile floor. She reports that her right leg bent underneath her and she fell on top of it. She reports that she was able to straighten the leg but it continues to appear deformed. She has sensation in her foot but is unable to move the leg due to pain. Holding her leg still makes some better; movement of palpation makes it worse.  Patient denies low back pain, right hip pain, loss of bowel or bladder control, numbness, weakness, tingling.  Past Medical History  Diagnosis Date  . Preterm labor   . OZHYQMVH(846.9)    Past Surgical History  Procedure Laterality Date  . Pilonidal cyst excision    . Cesarean section N/A 11/10/2013    Procedure: CESAREAN SECTION;  Surgeon: Bing Plume, MD;  Location: [facility];  Service: Obstetrics;  Laterality: N/A;  . Tubal ligation Bilateral 11/10/2013    Procedure: BILATERAL TUBAL LIGATION;  Surgeon: Bing Plume, MD;  Location: [facility];  Service: Obstetrics;  Laterality: Bilateral;   History reviewed. No pertinent family history. History  Substance Use Topics  . Smoking status: Former Games developer  . Smokeless tobacco: Former  Neurosurgeon    Quit date: 07/22/1998  . Alcohol Use: No   OB History    Gravida Para Term Preterm AB TAB SAB Ectopic Multiple Living   Review of Systems  Constitutional: Negative for fever and chills.  Gastrointestinal: Negative for nausea and vomiting.  Musculoskeletal: Positive for joint swelling and arthralgias. Negative for back pain, neck pain and neck stiffness.  Skin: Negative for wound.  Neurological: Negative for numbness.  Hematological: Does not bruise/bleed easily.  Psychiatric/Behavioral: The patient is not nervous/anxious.   All other systems reviewed and are negative.     Allergies  Review of patient's allergies indicates no known allergies.  Home Medications   Prior to Admission medications   Medication Sig Start Date End Date Taking? Authorizing Provider  Multiple Vitamin (MULTIVITAMIN WITH MINERALS) TABS tablet Take 1 tablet by mouth daily.   Yes Historical Provider, MD  ibuprofen (ADVIL,MOTRIN) 600 MG tablet Take 1 tablet (600 mg total) by mouth every 6 (six) hours. Patient not taking: Reported on 09/04/2014 11/13/13   Oliver Pila, MD  oxyCODONE-acetaminophen (PERCOCET/ROXICET) 5-325 MG per tablet Take 1-2 tablets by mouth every 4 (four) hours as needed for severe pain (moderate - severe pain). Patient not taking: Reported on 09/04/2014 11/13/13   Oliver Pila, MD   BP 125/78 mmHg  Pulse 74  Temp(Src) 99 F (37.2 C) (Oral)  Resp 12  Ht  (1.702 m)  Wt 200 lb (90.719 kg)  BMI 31.32 kg/m2  SpO2 97%  Breastfeeding? Yes Physical Exam  Constitutional: She appears well-developed and well-nourished. No distress.  HENT:  Head: Normocephalic and atraumatic.  Eyes: Conjunctivae are normal.  Neck: Normal range of motion.  Cardiovascular: Normal rate, regular rhythm, normal heart sounds and intact distal pulses.   No murmur heard. Pulses:      Radial pulses are 2+ on the right side, and 2+ on the left side.       Dorsalis pedis  pulses are 2+ on the right side, and 2+ on the left side.       Posterior tibial pulses are 2+ on the right side, and 2+ on the left side.  Capillary refill < 3 sec  Pulmonary/Chest: Effort normal and breath sounds normal.  Musculoskeletal: She exhibits tenderness. She exhibits no edema.  No ROM of the left knee; left leg in full extension with visible deformity  Neurological: She is alert. Coordination normal.  Sensation intact to dull and sharp in the BLE Strength 5/5 with dorsiflexion and plantar flexion; 0/5 at the knee as pt is unable to move it  Skin: Skin is warm and dry. She is not diaphoretic.  No tenting of the skin  Psychiatric: She has a normal mood and affect.  Nursing note and vitals reviewed.   ED Course  Reduction of dislocation Date/Time: 09/05/2014 12:20 AM Performed by: Dierdre Forth Authorized by: Dierdre Forth Consent: Verbal consent obtained. Written consent obtained. Risks and benefits: risks, benefits and alternatives were discussed Consent given by: patient Patient understanding: patient states understanding of the procedure being performed Patient consent: the patient's understanding of the procedure matches consent given Procedure consent: procedure consent matches procedure scheduled Relevant documents: relevant documents present and verified Site marked: the operative site was marked Imaging studies: imaging studies available Required items: required blood products, implants, devices, and special equipment available Patient identity confirmed: verbally with patient and arm band Time out: Immediately prior to procedure a "time out" was called to verify the correct patient, procedure, equipment, support staff and site/side marked as required. Preparation: Patient was prepped and draped in the usual sterile fashion. Local anesthesia used: no Patient sedated: yes Sedation type: moderate (conscious) sedation Sedatives: etomidate Analgesia:  hydromorphone Sedation start date/time: 09/04/2014 11:27 PM Sedation end date/time: 09/04/2014 11:40 PM Vitals: Vital signs were monitored during sedation. Patient tolerance: Patient tolerated the procedure well with no immediate complications Comments: Unable to reduce patellar dislocation; Sedation performed with Doug Sou, MD   (including critical care time) Labs Review Labs Reviewed  CBC  BASIC METABOLIC PANEL    Imaging Review Dg Knee Right Port  09/04/2014   CLINICAL DATA:  Status post fall.  Right knee pain and injury.  EXAM: PORTABLE RIGHT KNEE - 1-2 VIEW  COMPARISON:  None.  FINDINGS: The patella is laterally dislocated. No fracture is identified. Joint spaces are preserved. Calcification projects just superior to the medial epicondyle of the distal femur and may be related to old gastrocnemius trauma or could be a loose body. Small joint effusion is noted.  IMPRESSION: The patella is laterally dislocated.  No fracture is identified.   Electronically Signed   By: Drusilla Kanner M.D.   On: 09/04/2014 22:58     EKG Interpretation None      MDM   Final diagnoses:  Patellar dislocation, right, initial encounter    Emily Case presents with patellar dislocation.  We'll give pain control relaxers and reduced.  11:20PM Unable to reduce patella with pain control and Valium. Will need conscious sedation.  11:45PM Unable to reduce patella with conscious sedation with etomidate.  Dr. Rennis ChrisJacobowitz and I both attempted without reduction. Will consult ortho.  12:19 AM Case discussed with Dr. Luiz BlareGraves who will take pt to the OR.   Will obtain pre-op labs and ECG.  Last oral intake was 6PM.    BP 125/78 mmHg  Pulse 74  Temp(Src) 99 F (37.2 C) (Oral)  Resp 12  Ht 5\' 7"  (1.702 m)  Wt 200 lb (90.719 kg)  BMI 31.32 kg/m2  SpO2 97%  Breastfeeding? Yes   Dierdre ForthHannah Azile Minardi, PA-C 09/05/14 [id number]  Doug SouSam Jacubowitz, MD 09/05/14 [phone]

## 2014-09-04 NOTE — ED Provider Notes (Signed)
Patient complains of right knee pain after slipped and fell on wet tile floor. No other injury. The seizure sedation performed by me after x-ray reviewed. Attempted closed reduction of right right patella without success  Doug SouSam Sagal Gayton, MD 09/04/14 2337

## 2014-09-05 ENCOUNTER — Encounter (HOSPITAL_COMMUNITY): Payer: Self-pay | Admitting: Anesthesiology

## 2014-09-05 ENCOUNTER — Emergency Department (HOSPITAL_COMMUNITY): Payer: PRIVATE HEALTH INSURANCE | Admitting: Anesthesiology

## 2014-09-05 ENCOUNTER — Encounter (HOSPITAL_COMMUNITY)
Admission: EM | Disposition: A | Discharge status: Home / Self Care | Payer: Self-pay | Source: Home / Self Care | Attending: Emergency Medicine

## 2014-09-05 HISTORY — PX: PATELLAR TENDON REPAIR: SHX737

## 2014-09-05 LAB — CBC
HCT: 39.5 % (ref 36.0–46.0)
Hemoglobin: 12.8 g/dL (ref 12.0–15.0)
MCH: 27.6 pg (ref 26.0–34.0)
MCHC: 32.4 g/dL (ref 30.0–36.0)
MCV: 85.3 fL (ref 78.0–100.0)
Platelets: 274 10*3/uL (ref 150–400)
RBC: 4.63 MIL/uL (ref 3.87–5.11)
RDW: 14.2 % (ref 11.5–15.5)
WBC: 9.4 10*3/uL (ref 4.0–10.5)

## 2014-09-05 LAB — BASIC METABOLIC PANEL
Anion gap: 5 (ref 5–15)
BUN: 20 mg/dL (ref 6–23)
CHLORIDE: 109 mmol/L (ref 96–112)
CO2: 26 mmol/L (ref 19–32)
Calcium: 8.7 mg/dL (ref 8.4–10.5)
Creatinine, Ser: 0.76 mg/dL (ref 0.50–1.10)
GFR calc Af Amer: >90 mL/min (ref >90)
GFR calc non Af Amer: >90 mL/min (ref >90)
Glucose, Bld: 131 mg/dL — ABNORMAL HIGH (ref 70–99)
Potassium: 3.7 mmol/L (ref 3.5–5.1)
Sodium: 140 mmol/L (ref 135–145)

## 2014-09-05 SURGERY — REPAIR, TENDON, PATELLAR
Anesthesia: General | Site: Knee | Laterality: Right

## 2014-09-05 SURGERY — CLOSED REDUCTION, PATELLA
Anesthesia: General | Laterality: Right

## 2014-09-05 MED ORDER — FENTANYL CITRATE 0.05 MG/ML IJ SOLN
INTRAMUSCULAR | Status: AC
  Filled 2014-09-05: qty 5

## 2014-09-05 MED ORDER — SUCCINYLCHOLINE CHLORIDE 20 MG/ML IJ SOLN
INTRAMUSCULAR | Status: AC
  Filled 2014-09-05: qty 1

## 2014-09-05 MED ORDER — LIDOCAINE HCL (CARDIAC) 20 MG/ML IV SOLN
INTRAVENOUS | Status: DC | PRN
  Administered 2014-09-05: 50 mg via INTRAVENOUS

## 2014-09-05 MED ORDER — LACTATED RINGERS IV SOLN
INTRAVENOUS | Status: DC | PRN
  Administered 2014-09-05: 01:00:00 via INTRAVENOUS

## 2014-09-05 MED ORDER — PROPOFOL 10 MG/ML IV BOLUS
INTRAVENOUS | Status: DC | PRN
  Administered 2014-09-05: 200 mg via INTRAVENOUS

## 2014-09-05 MED ORDER — SODIUM CHLORIDE 0.9 % IJ SOLN
INTRAMUSCULAR | Status: AC
  Filled 2014-09-05: qty 10

## 2014-09-05 MED ORDER — ONDANSETRON HCL 4 MG/2ML IJ SOLN: 4.0000 mg | Freq: Once | INTRAMUSCULAR | Status: DC

## 2014-09-05 MED ORDER — DEXAMETHASONE SODIUM PHOSPHATE 4 MG/ML IJ SOLN
INTRAMUSCULAR | Status: DC | PRN
  Administered 2014-09-05: 8 mg via INTRAVENOUS

## 2014-09-05 MED ORDER — OXYCODONE-ACETAMINOPHEN 5-325 MG PO TABS: 1.0000 | ORAL_TABLET | ORAL | Status: DC | PRN

## 2014-09-05 MED ORDER — ONDANSETRON HCL 4 MG/2ML IJ SOLN: 4.0000 mg | Freq: Four times a day (QID) | INTRAMUSCULAR | Status: DC | PRN

## 2014-09-05 MED ORDER — LIDOCAINE HCL (CARDIAC) 20 MG/ML IV SOLN
INTRAVENOUS | Status: AC
  Filled 2014-09-05: qty 5

## 2014-09-05 MED ORDER — SUCCINYLCHOLINE CHLORIDE 20 MG/ML IJ SOLN
INTRAMUSCULAR | Status: DC | PRN
  Administered 2014-09-05: 100 mg via INTRAVENOUS

## 2014-09-05 MED ORDER — ONDANSETRON HCL 4 MG/2ML IJ SOLN
INTRAMUSCULAR | Status: DC | PRN
  Administered 2014-09-05: 4 mg via INTRAVENOUS

## 2014-09-05 MED ORDER — CEFAZOLIN SODIUM-DEXTROSE 2-3 GM-% IV SOLR
INTRAVENOUS | Status: AC
  Filled 2014-09-05: qty 50

## 2014-09-05 MED ORDER — PROPOFOL 10 MG/ML IV BOLUS
INTRAVENOUS | Status: AC
  Filled 2014-09-05: qty 20

## 2014-09-05 MED ORDER — HYDROMORPHONE HCL 1 MG/ML IJ SOLN: 0.2500 mg | INTRAMUSCULAR | Status: DC | PRN

## 2014-09-05 MED ORDER — OXYCODONE HCL 5 MG PO TABS
ORAL_TABLET | ORAL | Status: AC
  Administered 2014-09-05: 5 mg
  Filled 2014-09-05: qty 1

## 2014-09-05 MED ORDER — OXYCODONE HCL 5 MG/5ML PO SOLN
5.0000 mg | Freq: Once | ORAL | Status: DC | PRN
Start: 2014-09-05 — End: 2014-09-05

## 2014-09-05 MED ORDER — ARTIFICIAL TEARS OP OINT
TOPICAL_OINTMENT | OPHTHALMIC | Status: AC
  Filled 2014-09-05: qty 3.5

## 2014-09-05 MED ORDER — FENTANYL CITRATE 0.05 MG/ML IJ SOLN
INTRAMUSCULAR | Status: DC | PRN
  Administered 2014-09-05: 100 ug via INTRAVENOUS
  Administered 2014-09-05 (×3): 50 ug via INTRAVENOUS

## 2014-09-05 MED ORDER — OXYCODONE HCL 5 MG PO TABS: 5.0000 mg | ORAL_TABLET | Freq: Once | ORAL | Status: DC | PRN

## 2014-09-05 MED ORDER — ONDANSETRON HCL 4 MG/2ML IJ SOLN
INTRAMUSCULAR | Status: AC
  Filled 2014-09-05: qty 2

## 2014-09-05 SURGICAL SUPPLY — 13 items
BANDAGE ADH SHEER 1  50/CT (GAUZE/BANDAGES/DRESSINGS) ×2 IMPLANT
BANDAGE ELASTIC 4 VELCRO ST LF (GAUZE/BANDAGES/DRESSINGS) ×2 IMPLANT
BANDAGE ELASTIC 6 VELCRO ST LF (GAUZE/BANDAGES/DRESSINGS) ×2 IMPLANT
CUFF TOURNIQUET SINGLE 34IN LL (TOURNIQUET CUFF) IMPLANT
CUFF TOURNIQUET SINGLE 44IN (TOURNIQUET CUFF) IMPLANT
GAUZE SPONGE 2X2 8PLY NS (GAUZE/BANDAGES/DRESSINGS) ×2 IMPLANT
GOWN STRL REUS W/ TWL XL LVL3 (GOWN DISPOSABLE) ×1 IMPLANT
GOWN STRL REUS W/TWL XL LVL3 (GOWN DISPOSABLE) ×1
IMMOBILIZER KNEE 22 (SOFTGOODS) ×2 IMPLANT
KIT ROOM TURNOVER OR (KITS) ×2 IMPLANT
NEEDLE 18GX1X1/2 (RX/OR ONLY) (NEEDLE) ×2 IMPLANT
PAD ARMBOARD 7.5X6 YLW CONV (MISCELLANEOUS) ×4 IMPLANT
SYRINGE 60CC LL (MISCELLANEOUS) ×2 IMPLANT

## 2014-09-05 NOTE — ED Notes (Signed)
PA at bedside.

## 2014-09-05 NOTE — Anesthesia Procedure Notes (Signed)
Procedure Name: Intubation Date/Time: 09/05/2014 1:21 AM Performed by: Brien MatesMAHONY, Camilah Spillman D Pre-anesthesia Checklist: Patient identified, Emergency Drugs available, Suction available, Patient being monitored and Timeout performed Patient Re-evaluated:Patient Re-evaluated prior to inductionOxygen Delivery Method: Circle system utilized Preoxygenation: Pre-oxygenation with 100% oxygen Intubation Type: IV induction, Rapid sequence and Cricoid Pressure applied Laryngoscope Size: Miller and 2 Grade View: Grade I Tube type: Oral Tube size: 7.0 mm Number of attempts: 1 Airway Equipment and Method: Stylet Placement Confirmation: ETT inserted through vocal cords under direct vision,  positive ETCO2 and breath sounds checked- equal and bilateral Secured at: 22 cm Tube secured with: Tape Dental Injury: Teeth and Oropharynx as per pre-operative assessment

## 2014-09-05 NOTE — ED Notes (Signed)
Pt states she fell at work and not at home and wants to be evaluated for [employer]'s comp.

## 2014-09-05 NOTE — H&P (Signed)
PREOPERATIVE H&P  Chief Complaint: right knee pain  HPI: Emily Case is a 39 y.o. female who presents for evaluation of right knee pain. It has been present for several hours and has been worsening.  The patient presented to the emergency room where x-rays showed a laterally dislocated patella.  She underwent conscious sedation with inability to reduce the dislocation.  We are consult up for evaluation and management of irreducible patellar dislocation. She has failed conservative measures. Pain is rated as moderate.  Past Medical History  Diagnosis Date  . Preterm labor   . ZOXWRUEA(540.9)    Past Surgical History  Procedure Laterality Date  . Pilonidal cyst excision    . Cesarean section N/A 11/10/2013    Procedure: CESAREAN SECTION;  Surgeon: Bing Plume, MD;  Location: [facility];  Service: Obstetrics;  Laterality: N/A;  . Tubal ligation Bilateral 11/10/2013    Procedure: BILATERAL TUBAL LIGATION;  Surgeon: Bing Plume, MD;  Location: [facility];  Service: Obstetrics;  Laterality: Bilateral;   History   Social History  . Marital Status: Single    Spouse Name: N/A  . Number of Children: N/A  . Years of Education: N/A   Social History Main Topics  . Smoking status: Former Games developer  . Smokeless tobacco: Former Neurosurgeon    Quit date: 07/22/1998  . Alcohol Use: No  . Drug Use: Not on file  . Sexual Activity: Yes   Other Topics Concern  . None   Social History Narrative   No family history on file. No Known Allergies Prior to Admission medications   Medication Sig Start Date End Date Taking? Authorizing Provider  Multiple Vitamin (MULTIVITAMIN WITH MINERALS) TABS tablet Take 1 tablet by mouth daily.   Yes Historical Provider, MD  ibuprofen (ADVIL,MOTRIN) 600 MG tablet Take 1 tablet (600 mg total) by mouth every 6 (six) hours. Patient not taking: Reported on 09/04/2014 11/13/13   Oliver Pila, MD  oxyCODONE-acetaminophen (PERCOCET/ROXICET) 5-325 MG per tablet Take 1-2  tablets by mouth every 4 (four) hours as needed for severe pain (moderate - severe pain). Patient not taking: Reported on 09/04/2014 11/13/13   Oliver Pila, MD     Positive ROS: none  All other systems have been reviewed and were otherwise negative with the exception of those mentioned in the HPI and as above.  Physical Exam: Filed Vitals:   09/05/14 0000  BP: 125/78  Pulse: 74  Temp:   Resp: 12    General: Alert, no acute distress Cardiovascular: No pedal edema Respiratory: No cyanosis, no use of accessory musculature GI: No organomegaly, abdomen is soft and non-tender Skin: No lesions in the area of chief complaint Neurologic: Sensation intact distally Psychiatric: Patient is competent for consent with normal mood and affect Lymphatic: No axillary or cervical lymphadenopathy  MUSCULOSKELETAL: right knee: Palpable lateral mass with pain on all attempts at range of motion.  2+ distal pulses.  X-ray: X-ray showed laterally dislocated patella.  Assessment/Plan: 39 year old female with laterally dislocated patella which is failed closed manipulation.  The likelihood is that the patient has some sort of cartilage impaction fracture and/or rotated patella both of which preclude closed treatment.  At this point the patient needs open reduction of irreducible patellar dislocation.  I'll take her to the operating room for this procedure.  We will attempt to close reduction and if this fails will proceed with open reduction.  The risks benefits and alternatives were discussed with the patient including but not limited  to the risks of nonoperative treatment, versus surgical intervention including infection, bleeding, nerve injury, malunion, nonunion, hardware prominence, hardware failure, need for hardware removal, blood clots, cardiopulmonary complications, morbidity, mortality, among others, and they were willing to proceed.  Predicted outcome is good, although there will be at least a  six to nine month expected recovery.  Karsin Pesta L, MD 09/05/2014 12:07 AM

## 2014-09-05 NOTE — Discharge Instructions (Signed)
keeep knee immobilizer on all times.  Wbat with immobilizer and crutches as needed.  Ice to r knee X2-3 days and then as needed.

## 2014-09-05 NOTE — Anesthesia Postprocedure Evaluation (Signed)
Anesthesia Post Note  Patient: Emily Case  Procedure(s) Performed: Procedure(s) (LRB): CLOSED REDUCTION OF RIGHT PATELLA WITH NEEDLE ASPIRATION (Right)  Anesthesia type: General  Patient location: PACU  Post pain: Pain level controlled and Adequate analgesia  Post assessment: Post-op Vital signs reviewed, Patient's Cardiovascular Status Stable, Respiratory Function Stable, Patent Airway and Pain level controlled  Last Vitals:  Filed Vitals:   09/05/14 0242  BP: 109/67  Pulse: 84  Temp: 36.9 C  Resp: 14    Post vital signs: Reviewed and stable  Level of consciousness: awake, alert  and oriented  Complications: No apparent anesthesia complications

## 2014-09-05 NOTE — Brief Op Note (Signed)
09/04/2014 - 09/05/2014  1:37 AM  PATIENT:  [name] Brenes  39 y.o. female  PRE-OPERATIVE DIAGNOSIS:  Right Patellar Irreducible dislocation  POST-OPERATIVE DIAGNOSIS:  Right Patellar Irreducible dislocation  PROCEDURE:  Procedure(s): CLOSED REDUCTION OF RIGHT PATELLA WITH NEEDLE ASPIRATION (Right)  SURGEON:  Surgeon(s) and Role:    * Harvie JuniorJohn L January Bergthold, MD - Primary  PHYSICIAN ASSISTANT:   ASSISTANTS: none   ANESTHESIA:   general  EBL:     BLOOD ADMINISTERED:none  DRAINS: none   LOCAL MEDICATIONS USED:  NONE  SPECIMEN:  No Specimen  DISPOSITION OF SPECIMEN:  N/A  COUNTS:  YES  TOURNIQUET:  * No tourniquets in log *  DICTATION: .Other Dictation: Dictation Number [id number]  PLAN OF CARE: Discharge to home after PACU  PATIENT DISPOSITION:  PACU - hemodynamically stable.   Delay start of Pharmacological VTE agent (>24hrs) due to surgical blood loss or risk of bleeding: no

## 2014-09-05 NOTE — Transfer of Care (Signed)
Immediate Anesthesia Transfer of Care Note  Patient: Emily Case  Procedure(s) Performed: Procedure(s): CLOSED REDUCTION OF RIGHT PATELLA WITH NEEDLE ASPIRATION (Right)  Patient Location: PACU  Anesthesia Type:General  Level of Consciousness: awake, alert  and oriented  Airway & Oxygen Therapy: Patient Spontanous Breathing and Patient connected to nasal cannula oxygen  Post-op Assessment: Report given to RN and Post -op Vital signs reviewed and stable  Post vital signs: Reviewed and stable  Last Vitals:  Filed Vitals:   09/05/14 0139  BP:   Pulse: 89  Temp: 37.1 C  Resp:     Complications: No apparent anesthesia complications

## 2014-09-05 NOTE — Anesthesia Preprocedure Evaluation (Addendum)
Anesthesia Evaluation  Patient identified by MRN, date of birth, ID band Patient awake    Reviewed: Allergy & Precautions, NPO status , Patient's Chart, lab work & pertinent test results  Airway Mallampati: I  TM Distance: >3 FB Neck ROM: full    Dental  (+) Teeth Intact, Dental Advisory Given   Pulmonary former smoker,          Cardiovascular negative cardio ROS      Neuro/Psych  Headaches,    GI/Hepatic   Endo/Other    Renal/GU      Musculoskeletal   Abdominal   Peds  Hematology   Anesthesia Other Findings   Reproductive/Obstetrics (+) Breast feeding                             Anesthesia Physical Anesthesia Plan  ASA: I and emergent  Anesthesia Plan: General   Post-op Pain Management:    Induction: Intravenous, Rapid sequence and Cricoid pressure planned  Airway Management Planned: Oral ETT  Additional Equipment:   Intra-op Plan:   Post-operative Plan: Extubation in OR  Informed Consent: I have reviewed the patients History and Physical, chart, labs and discussed the procedure including the risks, benefits and alternatives for the proposed anesthesia with the patient or authorized representative who has indicated his/her understanding and acceptance.   Dental advisory given  Plan Discussed with: CRNA, Anesthesiologist and Surgeon  Anesthesia Plan Comments:        Anesthesia Quick Evaluation

## 2014-09-05 NOTE — Op Note (Signed)
NAMMarland Kitchen:  Carolynne EdouardJONES, Brittanny                ACCOUNT NO.:  [id number]  MEDICAL RECORD NO.:  [id number]  LOCATION:  [facility]                         FACILITY:  [facility]  PHYSICIAN:  Harvie JuniorJohn L. Karissa Meenan, M.D.   DATE OF BIRTH:  [birth year]  DATE OF PROCEDURE:  09/05/2014 DATE OF DISCHARGE:  09/05/2014                              OPERATIVE REPORT   PREOPERATIVE DIAGNOSIS:  Irreducible patellar dislocation, right.  POSTOPERATIVE DIAGNOSIS:  Irreducible patellar dislocation, right.  PRINCIPAL PROCEDURE: 1. Closed reduction of patellar dislocation. 2. Aspiration of right knee under anesthesia.  SURGEON:  Harvie JuniorJohn L. Verdell Dykman, M.D.  ANESTHESIA:  General.  BRIEF HISTORY:  Ms. Emily Case is a 39 year old female with a history of slip and fall, had a patellar dislocation.  She underwent etomidate sedation in the emergency room, and they are unable to reduce the patella.  We were consulted for management after inability to reduce the patella fracture.  DESCRIPTION OF PROCEDURE:  The patient was taken to the operating room. After adequate anesthesia was obtained with general anesthetic, the patient was placed supine on the operating table and then after a general anesthetic had been induced, we attempted manipulative closed reduction of the patella.  Initially, there was a little bit of difficulty on hinging the patella but ultimately did reduce back to an anatomic position.  She had a large effusion at that point after sterilely prepping the knee.  We aspirated 40 mL of blood out of the knee.  We then put on a compressive dressing.  We then put a compressive wrap on the knee and a knee immobilizer, and she was taken to the recovery room, where she was noted to be in satisfactory condition. Estimated blood loss during the procedure was none.     Harvie JuniorJohn L. Clary Boulais, M.D.     Ranae PlumberJLG/MEDQ  D:  09/05/2014  T:  09/05/2014  Job:  [id number]

## 2014-09-06 ENCOUNTER — Encounter (HOSPITAL_COMMUNITY): Payer: Self-pay | Admitting: Orthopedic Surgery

## 2016-10-19 ENCOUNTER — Emergency Department (HOSPITAL_BASED_OUTPATIENT_CLINIC_OR_DEPARTMENT_OTHER)
Admission: EM | Admit: 2016-10-19 | Discharge: 2016-10-19 | Disposition: A | Discharge status: Home / Self Care | Payer: Medicaid Other | Attending: Emergency Medicine | Admitting: Emergency Medicine

## 2016-10-19 ENCOUNTER — Encounter (HOSPITAL_BASED_OUTPATIENT_CLINIC_OR_DEPARTMENT_OTHER): Payer: Self-pay | Admitting: Emergency Medicine

## 2016-10-19 ENCOUNTER — Emergency Department (HOSPITAL_BASED_OUTPATIENT_CLINIC_OR_DEPARTMENT_OTHER): Payer: Medicaid Other

## 2016-10-19 DIAGNOSIS — S93492A Sprain of other ligament of left ankle, initial encounter: Secondary | ICD-10-CM | POA: Diagnosis not present

## 2016-10-19 DIAGNOSIS — Y9389 Activity, other specified: Secondary | ICD-10-CM | POA: Diagnosis not present

## 2016-10-19 DIAGNOSIS — Y92002 Bathroom of unspecified non-institutional (private) residence single-family (private) house as the place of occurrence of the external cause: Secondary | ICD-10-CM | POA: Insufficient documentation

## 2016-10-19 DIAGNOSIS — S99912A Unspecified injury of left ankle, initial encounter: Secondary | ICD-10-CM | POA: Diagnosis present

## 2016-10-19 DIAGNOSIS — S93402A Sprain of unspecified ligament of left ankle, initial encounter: Secondary | ICD-10-CM

## 2016-10-19 DIAGNOSIS — W010XXA Fall on same level from slipping, tripping and stumbling without subsequent striking against object, initial encounter: Secondary | ICD-10-CM | POA: Insufficient documentation

## 2016-10-19 DIAGNOSIS — Z87891 Personal history of nicotine dependence: Secondary | ICD-10-CM | POA: Insufficient documentation

## 2016-10-19 DIAGNOSIS — Y999 Unspecified external cause status: Secondary | ICD-10-CM | POA: Insufficient documentation

## 2016-10-19 MED ORDER — ACETAMINOPHEN 500 MG PO TABS
1000.0000 mg | ORAL_TABLET | Freq: Once | ORAL | Status: AC
  Administered 2016-10-19: 1000 mg via ORAL
  Filled 2016-10-19: qty 2

## 2016-10-19 NOTE — ED Triage Notes (Signed)
Patient reports she slipped when she stepped down while exiting the restroom at [employer]. Patient states her ankle popped.

## 2016-10-19 NOTE — Discharge Instructions (Signed)
Please read and follow all provided instructions.  Your diagnoses today include:  1. Sprain of left ankle, unspecified ligament, initial encounter     Tests performed today include:  An x-ray of your ankle - does NOT show any broken bones  Vital signs. See below for your results today.   Medications prescribed:   None  Take any prescribed medications only as directed.  Home care instructions:   Follow any educational materials contained in this packet  Follow R.I.C.E. Protocol:  R - rest your injury   I  - use ice on injury without applying directly to skin  C - compress injury with bandage or splint  E - elevate the injury as much as possible  Follow-up instructions: Please follow-up with your primary care provider if you continue to have significant pain or trouble walking in 1 week. In this case you may have a severe sprain that requires further care.   Return instructions:   Please return if your toes are numb or tingling, appear gray or blue, or you have severe pain (also elevate leg and loosen splint or wrap)  Please return to the Emergency Department if you experience worsening symptoms.   Please return if you have any other emergent concerns.  Additional Information:  Your vital signs today were: BP 110/71 (BP Location: Right Arm)    Pulse 78    Temp 98.4 F (36.9 C) (Oral)    Resp 18    Ht  (1.702 m)    Wt 87.1 kg    SpO2 100%    Breastfeeding? No    BMI 30.07 kg/m  If your blood pressure (BP) was elevated above 135/85 this visit, please have this repeated by your doctor within one month. -------------- Your caregiver has diagnosed you as suffering from an ankle sprain. Ankle sprain occurs when the ligaments that hold the ankle joint together are stretched or torn. It may take 4 to 6 weeks to heal.  For Activity: If prescribed crutches, use crutches with non-weight bearing for the first few days. Then, you may walk on your ankle as the pain allows,  or as instructed. Start gradually with weight bearing on the affected ankle. Once you can walk pain free, then try jogging. When you can run forwards, then you can try moving side-to-side. If you cannot walk without crutches in one week, you need a re-check. --------------

## 2016-10-19 NOTE — ED Provider Notes (Signed)
[facility] Provider Note   CSN: [id number] Arrival date & time: 10/19/16  1720  By signing my name below, I, Nelwyn Salisbury, attest that this documentation has been prepared under the direction and in the presence of non-physician practitioner, Rhea Bleacher PA-C. Electronically Signed: Nelwyn Salisbury, Scribe. 10/19/2016. 5:50 PM.  History   Chief Complaint Chief Complaint  Patient presents with  . Ankle Pain    left   The history is provided by the patient. No language interpreter was used.   HPI Comments:  Emily Case is a 41 y.o. female who presents to the Emergency Department complaining of constant, moderate left ankle pain onset earlier today. Pt states she was in the bathroom at the grocery store when she slipped on some paper towels and heard a pop. She reports associated joint swelling. Her pain is exacerbated with palpation. Pt denies any weakness or numbness, but notes she is unable to bear weight on the ankle.   Past Medical History:  Diagnosis Date  . Headache(784.0)   . Preterm labor     Patient Active Problem List   Diagnosis Date Noted  . 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 Pregnancy 11/10/2013  . H/O cesarean section 11/10/2013    Past Surgical History:  Procedure Laterality Date  . CESAREAN SECTION N/A 11/10/2013   Procedure: CESAREAN SECTION;  Surgeon: Bing Plume, MD;  Location: [facility];  Service: Obstetrics;  Laterality: N/A;  . PATELLAR TENDON REPAIR Right 09/05/2014   Procedure: CLOSED REDUCTION OF RIGHT PATELLA WITH NEEDLE ASPIRATION;  Surgeon: Harvie Junior, MD;  Location: [facility];  Service: Orthopedics;  Laterality: Right;  . PILONIDAL CYST EXCISION    . TUBAL LIGATION Bilateral 11/10/2013   Procedure: BILATERAL TUBAL LIGATION;  Surgeon: Bing Plume, MD;  Location: [facility];  Service: Obstetrics;  Laterality: Bilateral;    OB History    Gravida Para Term Preterm AB Living   SAB TAB Ectopic Multiple Live Births          3       Home Medications    Prior to Admission medications   Medication Sig Start Date End Date Taking? Authorizing Provider  Multiple Vitamin (MULTIVITAMIN WITH MINERALS) TABS tablet Take 1 tablet by mouth daily.   Yes Historical Provider, MD    Family History History reviewed. No pertinent family history.  Social History Social History  Substance Use Topics  . Smoking status: Former Games developer  . Smokeless tobacco: Former Neurosurgeon    Quit date: 07/22/1998  . Alcohol use Yes     Comment: occ     Allergies   Bee venom and Other   Review of Systems Review of Systems  Constitutional: Negative for activity change.  Musculoskeletal: Positive for arthralgias and gait problem. Negative for back pain, joint swelling and neck pain.  Skin: Negative for wound.  Neurological: Negative for weakness and numbness.     Physical Exam Updated Vital Signs BP 110/71 (BP Location: Right Arm)   Pulse 78   Temp 98.4 F (36.9 C) (Oral)   Resp 18   Ht  (1.702 m)   Wt 192 lb (87.1 kg)   SpO2 100%   Breastfeeding? No   BMI 30.07 kg/m   Physical Exam  Constitutional: She is oriented to person, place, and time. She appears well-developed and well-nourished. No distress.  HENT:  Head: Normocephalic and atraumatic.  Eyes: Conjunctivae are normal. Pupils are  equal, round, and reactive to light.  Neck: Normal range of motion. Neck supple.  Cardiovascular: Normal rate and normal pulses.  Exam reveals no decreased pulses.   Pulses:      Dorsalis pedis pulses are 2+ on the right side, and 2+ on the left side.       Posterior tibial pulses are 2+ on the right side, and 2+ on the left side.  Pulmonary/Chest: Effort normal.  Abdominal: She exhibits no distension.  Musculoskeletal: She exhibits tenderness. She exhibits no edema.  Tenderness to left ankle over lateral malleolus with moderate swelling. 2+ pedal pulses. Sensation normal.   Neurological: She is alert and oriented to person,  place, and time. No sensory deficit.  Motor, sensation, and vascular distal to the injury is fully intact.   Skin: Skin is warm and dry.  Psychiatric: She has a normal mood and affect.  Nursing note and vitals reviewed.    ED Treatments / Results  DIAGNOSTIC STUDIES:  Oxygen Saturation is 100% on RA, normal by my interpretation.    COORDINATION OF CARE:  5:57 PM Discussed treatment plan with pt at bedside which includes ankle brace and pain management and pt agreed to plan.   Radiology Dg Ankle Complete Left  Result Date: 10/19/2016 CLINICAL DATA:  Slipped and rolled ankle in the bathroom today, unable to bear weight, lateral ankle swelling, initial encounter EXAM: LEFT ANKLE COMPLETE - 3+ VIEW COMPARISON:  None FINDINGS: Mild lateral soft tissue swelling. Osseous mineralization normal. Joint spaces preserved. No acute fracture, dislocation, or bone destruction. IMPRESSION: No acute osseous abnormalities. Electronically Signed   By: Ulyses Southward M.D.   On: 10/19/2016 17:49    Procedures Procedures (including critical care time)  Medications Ordered in ED Medications - No data to display   Initial Impression / Assessment and Plan / ED Course  I have reviewed the triage vital signs and the nursing notes.  Pertinent labs & imaging results that were available during my care of the patient were reviewed by me and considered in my medical decision making (see chart for details).     Vital signs reviewed and are as follows: Vitals:   10/19/16 1728  BP: 110/71  Pulse: 78  Resp: 18  Temp: 98.4 F (36.9 C)   Patient was counseled on RICE protocol and told to rest injury, use ice for no longer than 15 minutes every hour, compress the area, and elevate above the level of their heart as much as possible to reduce swelling. Questions answered. Patient verbalized understanding.      Final Clinical Impressions(s) / ED Diagnoses   Final diagnoses:  Sprain of left ankle,  unspecified ligament, initial encounter   Patient with ankle sprain, negative x-rays. Lower extremity is neurovascularly intact. Conservative measures indicated. Patient provided with ASO and crutches.  New Prescriptions Discharge Medication List as of 10/19/2016  6:21 PM    I personally performed the services described in this documentation, which was scribed in my presence. The recorded information has been reviewed and is accurate.     Renne Crigler, PA-C 10/19/16 1948    Geoffery Lyons, MD 10/19/16 [phone]

## 2018-07-21 ENCOUNTER — Other Ambulatory Visit: Payer: Self-pay | Admitting: Physician Assistant

## 2018-07-21 DIAGNOSIS — Z1231 Encounter for screening mammogram for malignant neoplasm of breast: Secondary | ICD-10-CM

## 2018-07-24 ENCOUNTER — Ambulatory Visit
Admission: RE | Admit: 2018-07-24 | Discharge: 2018-07-24 | Disposition: A | Discharge status: Home / Self Care | Payer: Medicaid Other | Source: Ambulatory Visit | Attending: Physician Assistant | Admitting: Physician Assistant

## 2018-07-24 DIAGNOSIS — Z1231 Encounter for screening mammogram for malignant neoplasm of breast: Secondary | ICD-10-CM

## 2018-11-18 NOTE — Congregational Nurse Program (Signed)
Pt voiced no physical concerns at today's visit, no signs of distress observed today 

## 2019-06-08 IMAGING — MG DIGITAL SCREENING BILATERAL MAMMOGRAM WITH TOMO AND CAD
8 series · 8 of 24 positions shown · non-contrast
Comparison: None.

CLINICAL DATA: Screening.

EXAM:
DIGITAL SCREENING BILATERAL MAMMOGRAM WITH TOMO AND CAD

[R MLO synth-2D]
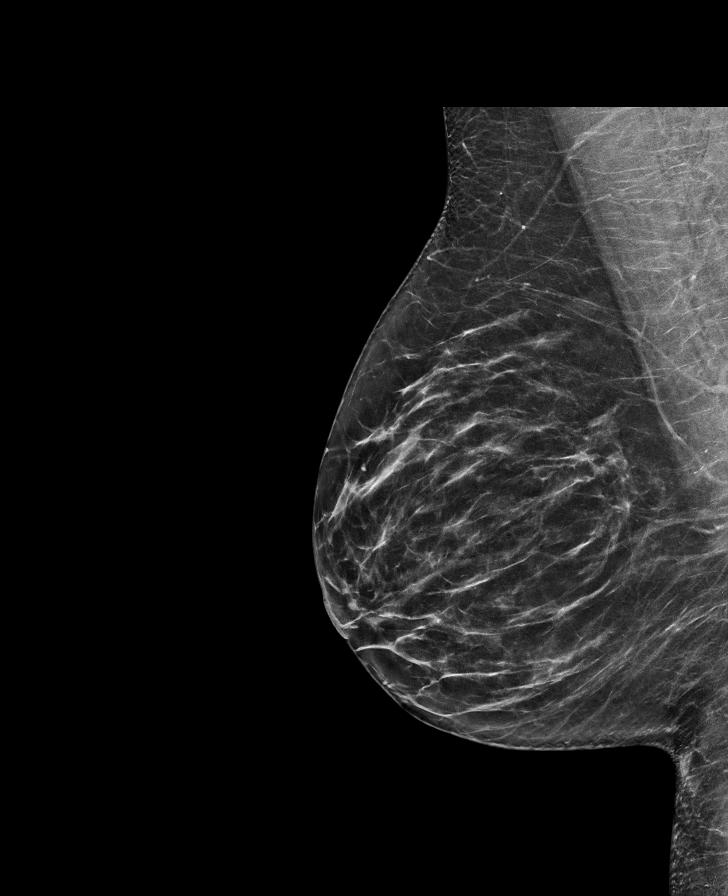

[L MLO synth-2D]
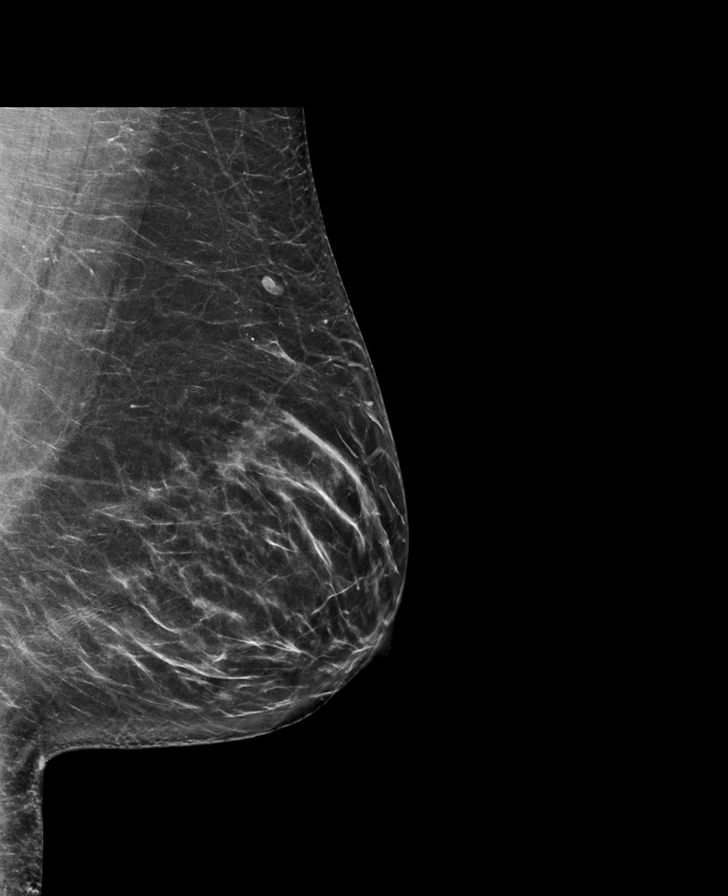

[R CC synth-2D]
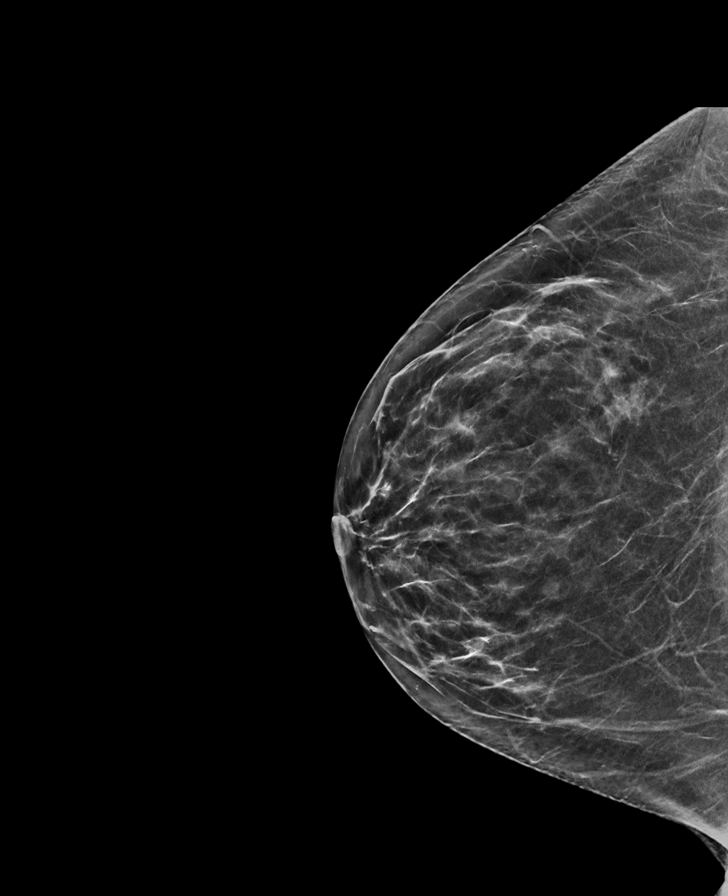

[L CC synth-2D]
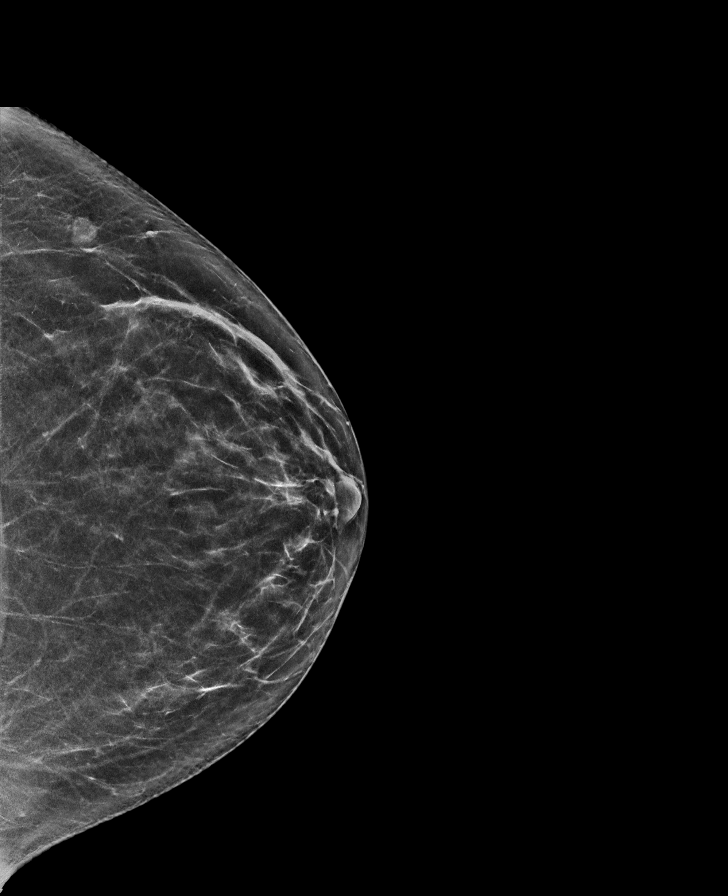

[L MLO tomo · tomo slice 41/80.0]
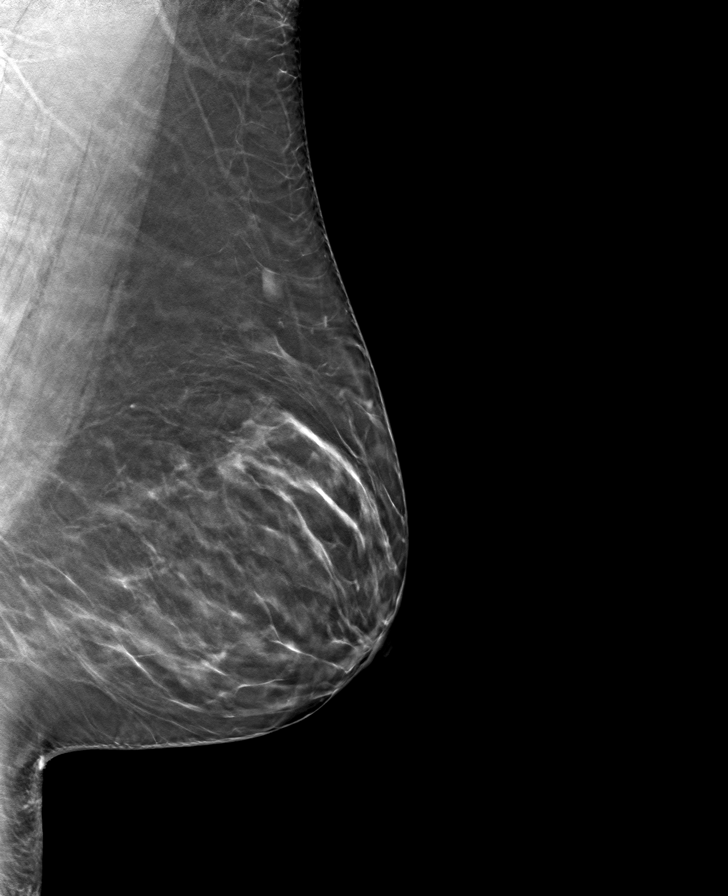

[R CC tomo · tomo slice 35/70.0]
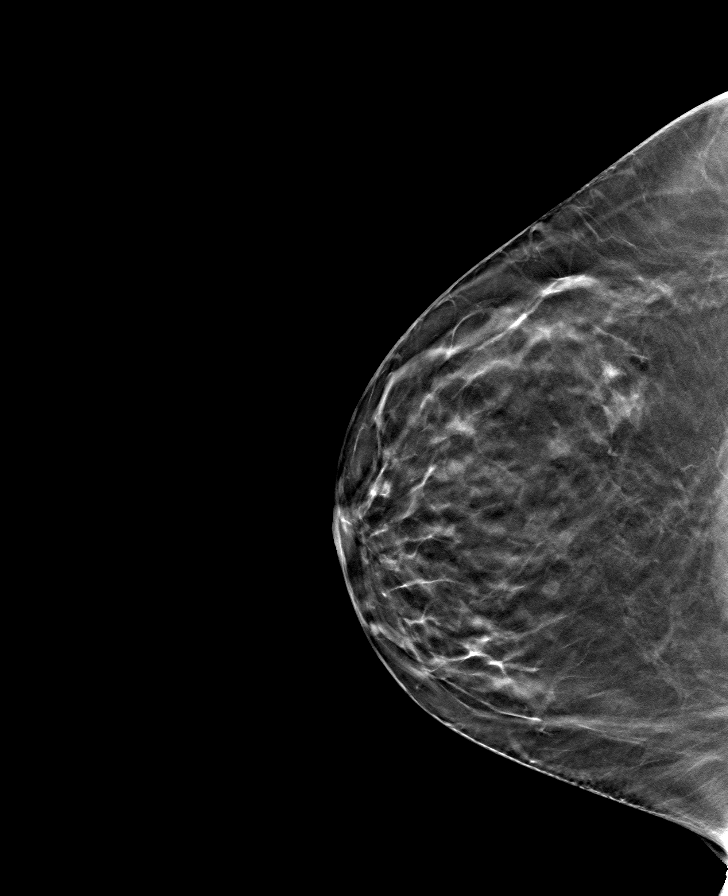

[L CC tomo · tomo slice 37/72.0]
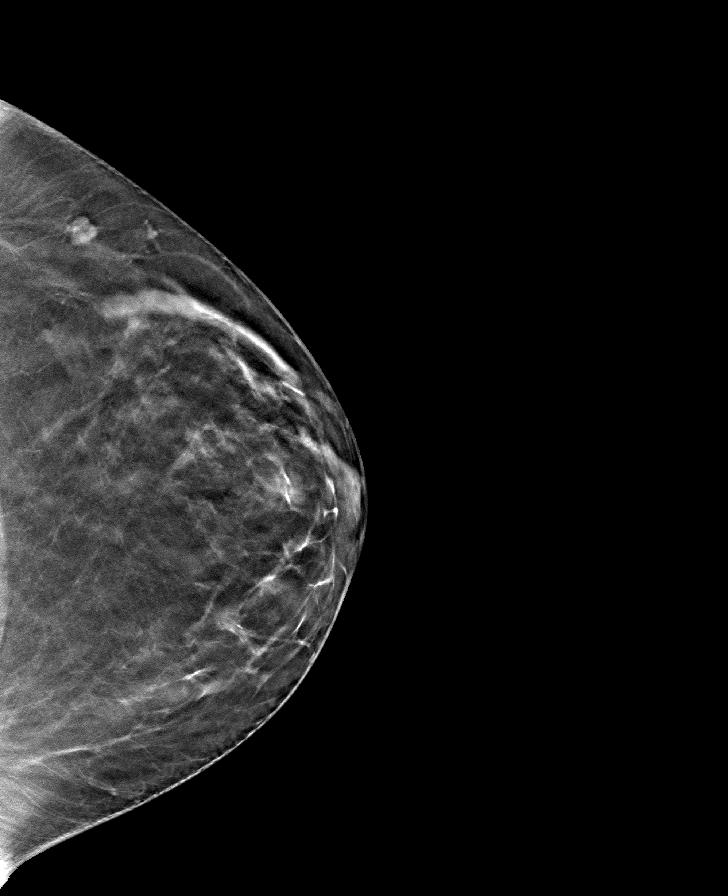

[R MLO tomo · tomo slice 38/75.0]
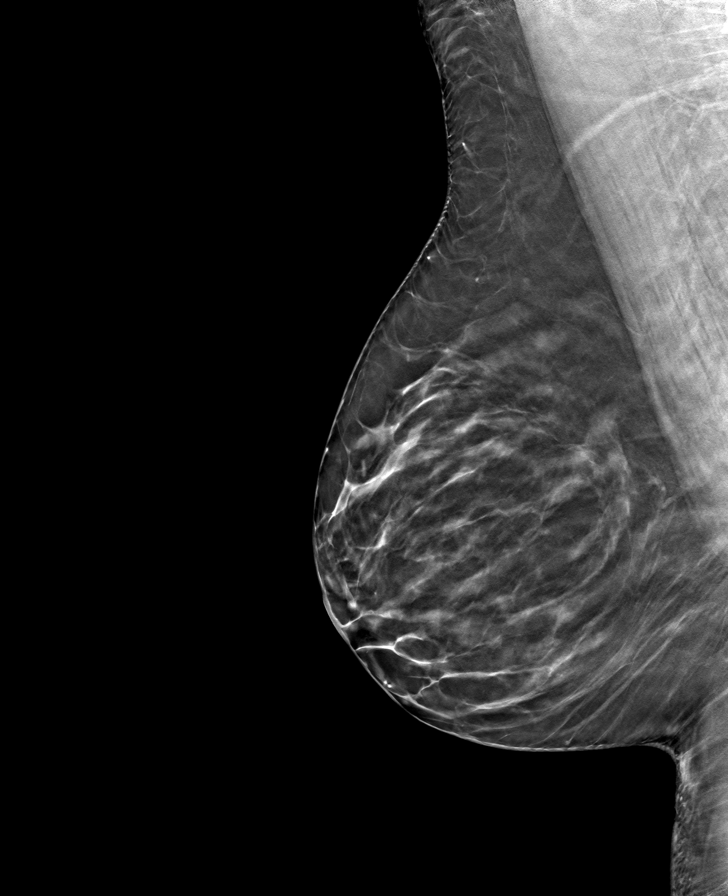

[8 of 24 positions shown; findings below may reference images not displayed]

ACR Breast Density Category b: There are scattered areas of
fibroglandular density.
FINDINGS: There are no findings suspicious for malignancy. Images were
processed with CAD.
IMPRESSION: No mammographic evidence of malignancy. A result letter of this
screening mammogram will be mailed directly to the patient.

RECOMMENDATION:
Screening mammogram in one year. (Code:Y5-G-EJ6)

BI-RADS CATEGORY  1: Negative.
